# Patient Record
Sex: Female | Born: 1970 | Race: Black or African American | Hispanic: No | Marital: Single | Smoking: Current some day smoker
Health system: Southern US, Community
[De-identification: ages and names within clinical notes are randomized; demographics above are authoritative.]

## PROBLEM LIST (undated history)

## (undated) DIAGNOSIS — R55 Syncope and collapse: Secondary | ICD-10-CM

## (undated) DIAGNOSIS — F329 Major depressive disorder, single episode, unspecified: Secondary | ICD-10-CM

## (undated) DIAGNOSIS — R569 Unspecified convulsions: Secondary | ICD-10-CM

## (undated) HISTORY — DX: Syncope and collapse: R55

---

## 1993-07-10 HISTORY — PX: OTHER SURGICAL HISTORY: SHX169

## 2010-06-21 ENCOUNTER — Ambulatory Visit: Payer: Self-pay | Admitting: Licensed Clinical Social Worker

## 2010-06-30 ENCOUNTER — Ambulatory Visit: Payer: Self-pay | Admitting: Licensed Clinical Social Worker

## 2010-07-12 ENCOUNTER — Ambulatory Visit: Admit: 2010-07-12 | Payer: Self-pay | Admitting: Licensed Clinical Social Worker

## 2013-07-30 ENCOUNTER — Ambulatory Visit
Admission: RE | Admit: 2013-07-30 | Discharge: 2013-07-30 | Disposition: A | Discharge status: Home / Self Care | Payer: Federal, State, Local not specified - PPO | Source: Ambulatory Visit | Attending: Family Medicine | Admitting: Family Medicine

## 2013-07-30 ENCOUNTER — Ambulatory Visit (INDEPENDENT_AMBULATORY_CARE_PROVIDER_SITE_OTHER): Payer: Federal, State, Local not specified - PPO | Admitting: Family Medicine

## 2013-07-30 VITALS — BP 110/80 | HR 90 | Temp 98.3°F | Resp 16 | Ht 61.5 in | Wt 195.2 lb

## 2013-07-30 DIAGNOSIS — D509 Iron deficiency anemia, unspecified: Secondary | ICD-10-CM

## 2013-07-30 DIAGNOSIS — J019 Acute sinusitis, unspecified: Secondary | ICD-10-CM

## 2013-07-30 DIAGNOSIS — R519 Headache, unspecified: Secondary | ICD-10-CM

## 2013-07-30 DIAGNOSIS — R51 Headache: Secondary | ICD-10-CM

## 2013-07-30 LAB — POCT CBC
Granulocyte percent: 61.7 %G (ref 37–80)
HEMATOCRIT: 33.2 % — AB (ref 37.7–47.9)
Hemoglobin: 9.4 g/dL — AB (ref 12.2–16.2)
LYMPH, POC: 3.1 (ref 0.6–3.4)
MCH, POC: 21.1 pg — AB (ref 27–31.2)
MCHC: 28.3 g/dL — AB (ref 31.8–35.4)
MCV: 74.4 fL — AB (ref 80–97)
MID (cbc): 0.7 (ref 0–0.9)
MPV: 7 fL (ref 0–99.8)
POC Granulocyte: 6.2 (ref 2–6.9)
POC LYMPH %: 31.4 % (ref 10–50)
POC MID %: 6.9 %M (ref 0–12)
Platelet Count, POC: 443 10*3/uL — AB (ref 142–424)
RBC: 4.46 M/uL (ref 4.04–5.48)
RDW, POC: 20.5 %
WBC: 10 10*3/uL (ref 4.6–10.2)

## 2013-07-30 LAB — C-REACTIVE PROTEIN

## 2013-07-30 LAB — POCT URINE PREGNANCY: Preg Test, Ur: NEGATIVE

## 2013-07-30 LAB — FERRITIN: Ferritin: 2 ng/mL — ABNORMAL LOW (ref 10–291)

## 2013-07-30 LAB — POCT SEDIMENTATION RATE: POCT SED RATE: 36 mm/hr — AB (ref 0–22)

## 2013-07-30 MED ORDER — AMOXICILLIN-POT CLAVULANATE 875-125 MG PO TABS: 1.0000 | ORAL_TABLET | Freq: Two times a day (BID) | ORAL | Status: DC

## 2013-07-30 MED ORDER — IPRATROPIUM BROMIDE 0.03 % NA SOLN: 2.0000 | Freq: Four times a day (QID) | NASAL | Status: DC

## 2013-07-30 MED ORDER — BUTALBITAL-APAP-CAFFEINE 50-325-40 MG PO TABS: 1.0000 | ORAL_TABLET | Freq: Four times a day (QID) | ORAL | Status: DC | PRN

## 2013-07-30 NOTE — Patient Instructions (Signed)
I will give you a call as soon as your CT results are back,  Assuming your CT looks good we will treat your with an antibiotic for sinusitis (augmentin), atrovent nasal spray for drainage.  You can use the fiorcet medication as needed for headache- however remember this can make you a bit sleepy so do not use it when you need to drive.    Let me know if you are not feeling better in the next few days!    I also did notice that you are anemic; this should be followed up in the next month or so.  I will have the lab check your iron level and let you know the results.

## 2013-07-30 NOTE — Progress Notes (Addendum)
[facility] [address], [place] [address] [phone]  Date:  07/30/2013   Name:  Carol Fowler   DOB:  [birth year]   MRN:  [id number]  PCP:  No PCP Per Patient    Chief Complaint: Headache and sinus pressure   History of Present Illness:  Carol Fowler is a 43 y.o. very pleasant female patient who presents with the following:  Here today with illness.  She is an occasional smoker, otherwise generally healthy.  She is here today with a "terrible HA" for the last 6 days or so.  She notes pressure in her face, if she bends her head down or lays back she has more pain.  The pain is mostly on the right side of her head.  No vision change, no rash to suggest zoster She has not noted a fever, cough, no ST or earache. She has a lot of PND.   No GI symptoms.    Her menses have been spaced out for about 2 years.  She has a Mirena IUD in place.    There are no active problems to display for this patient.   No past medical history on file.  No past surgical history on file.  History  Substance Use Topics  . Smoking status: Current Some Day Smoker  . Smokeless tobacco: Not on file     Comment: smokes teo to three a day and then goes several days without  . Alcohol Use: Not on file    No family history on file.  No Known Allergies  Medication list has been reviewed and updated.  No current outpatient prescriptions on file prior to visit.   No current facility-administered medications on file prior to visit.    Review of Systems:  As per HPI- otherwise negative.   Physical Examination: Filed Vitals:   07/30/13 0836  BP: 98/76  Pulse: 90  Temp: 98.3 F (36.8 C)  Resp: 16   Filed Vitals:   07/30/13 0836  Height: 5' 1.5" (1.562 m)  Weight: 195 lb 3.2 oz (88.542 kg)   Body mass index is 36.29 kg/(m^2). Ideal Body Weight: Weight in (lb) to have BMI = 25: 134.2  GEN: WDWN, NAD, Non-toxic, A & O x 3, looks well, overweight HEENT:  Atraumatic, Normocephalic. Neck supple. No masses, No LAD.  Bilateral TM wnl, oropharynx normal.  PEERL,EOMI.   Ears and Nose: No external deformity. CV: RRR, No M/G/R. No JVD. No thrill. No extra heart sounds. PULM: CTA B, no wheezes, crackles, rhonchi. No retractions. No resp. distress. No accessory muscle use. ABD: S, NT, ND EXTR: No c/c/e NEURO Normal gait. Normal neuro exam- normal strength, sensation and DTR all extremities,  Normal romberg testing PSYCH: Normally interactive. Conversant. Not depressed or anxious appearing.  Calm demeanor.   Results for orders placed in visit on 07/30/13  POCT CBC      Result Value Range   WBC 10.0  4.6 - 10.2 K/uL   Lymph, poc 3.1  0.6 - 3.4   POC LYMPH PERCENT 31.4  10 - 50 %L   MID (cbc) 0.7  0 - 0.9   POC MID % 6.9  0 - 12 %M   POC Granulocyte 6.2  2 - 6.9   Granulocyte percent 61.7  37 - 80 %G   RBC 4.46  4.04 - 5.48 M/uL   Hemoglobin 9.4 (*) 12.2 - 16.2 g/dL   HCT, POC 29.533.2 (*) 62.137.7 - 47.9 %  MCV 74.4 (*) 80 - 97 fL   MCH, POC 21.1 (*) 27 - 31.2 pg   MCHC 28.3 (*) 31.8 - 35.4 g/dL   RDW, POC 16.1     Platelet Count, POC 443 (*) 142 - 424 K/uL   MPV 7.0  0 - 99.8 fL  POCT SEDIMENTATION RATE      Result Value Range   POCT SED RATE 36 (*) 0 - 22 mm/hr  POCT URINE PREGNANCY      Result Value Range   Preg Test, Ur Negative      Assessment and Plan: Severe headache - Plan: POCT CBC, POCT SEDIMENTATION RATE, C-reactive protein, CT Head Wo Contrast, POCT urine pregnancy, butalbital-acetaminophen-caffeine (FIORICET, ESGIC) 50-325-40 MG per tablet  Sinusitis, acute - Plan: ipratropium (ATROVENT) 0.03 % nasal spray, amoxicillin-clavulanate (AUGMENTIN) 875-125 MG per tablet  Microcytic anemia - Plan: Ferritin  Unusual HA.  Suspect sinusitis vs migraine from her symptoms and exam. She is concerned about this unusual and severe HA and would like to proceed with CT.  Assuming negative will treat for sinusitis and associated migraine HA.    Signed Abbe Amsterdam, MD  Sent for CT head today due to unusual HA  CT HEAD WITHOUT CONTRAST  TECHNIQUE: Contiguous axial images were obtained from the base of the skull through the vertex without intravenous contrast. COMPARISON: None.  FINDINGS: Ventricle size is normal. Negative for acute or chronic infarction. Negative for hemorrhage or fluid collection. Negative for mass or edema. No shift of the midline structures.  Calvarium is intact.  IMPRESSION: Normal  Called and let her know.  Will use atrovent NS, augmentin and fiorcet for her symptoms.   Microcytic anemia: check ferritin.  She is currently taking some iron, this may need to be increased. She is no longer losing blood from her menses so may also need to consider hemoccult testing.   She will let me know if not better in the next few days- Sooner if worse.    1/24: gave her a call to check on her.  She is overall feeling better, her sinuses are improved and her HA are less severe.   Discussed her anemia.  Her iron is very low- she is taking 4X 240mg  of iron a day.  She has had problems with anemia for about 5 years; she thought it would get better since she has her mirena IUD and rarely has menstrual bleeding.  However this is not the case.  I will refer her to hematology for further work up.

## 2013-08-02 ENCOUNTER — Encounter: Payer: Self-pay | Admitting: Family Medicine

## 2013-08-02 NOTE — Addendum Note (Signed)
Addended by: Abbe AmsterdamOPLAND, Jamason Peckham C on: 08/02/2013 12:55 PM   Modules accepted: Orders

## 2013-08-04 ENCOUNTER — Telehealth: Payer: Self-pay | Admitting: Family Medicine

## 2013-08-04 NOTE — Telephone Encounter (Signed)
Called the heme- onc clinic today- the referral is in clinic and they will schedule appt

## 2013-08-23 ENCOUNTER — Encounter: Payer: Self-pay | Admitting: Family Medicine

## 2014-04-14 ENCOUNTER — Ambulatory Visit (INDEPENDENT_AMBULATORY_CARE_PROVIDER_SITE_OTHER): Payer: Federal, State, Local not specified - PPO | Admitting: Physician Assistant

## 2014-04-14 VITALS — BP 118/66 | HR 108 | Temp 99.9°F | Resp 18 | Ht 64.0 in | Wt 187.0 lb

## 2014-04-14 DIAGNOSIS — S0181XA Laceration without foreign body of other part of head, initial encounter: Secondary | ICD-10-CM

## 2014-04-14 DIAGNOSIS — S0990XA Unspecified injury of head, initial encounter: Secondary | ICD-10-CM

## 2014-04-14 NOTE — Progress Notes (Signed)
I was directly involved with the patient's care and agree with the physical, diagnosis and treatment plan and actively participated during the procedure.  

## 2014-04-14 NOTE — Patient Instructions (Signed)
WOUND CARE Please return in 5 days to have your stitches/staples removed or sooner if you have concerns.  Keep area clean and dry for 24 hours. Do not remove bandage, if applied.  After 24 hours, remove bandage and wash wound gently with mild soap and warm water. Reapply a new bandage after cleaning wound, if directed.  Continue daily cleansing with soap and water until stitches/staples are removed.  Do not apply any ointments or creams to the wound while stitches/staples are in place, as this may cause delayed healing.  Notify the office if you experience any of the following signs of infection: Swelling, redness, pus drainage, streaking, fever >101.0 F  Notify the office if you experience excessive bleeding that does not stop after 15-20 minutes of constant, firm pressure.  

## 2014-04-14 NOTE — Progress Notes (Signed)
   Subjective:    Patient ID: Carol Fowler, female    DOB: 1970-11-30, 43 y.o.   MRN: [id number]  HPI This is a 43 year old female here for a chief complaint of a facial laceration.  Three hours ago (4pm), she hit her face against the corner of her dresser after she fell off the barstool when she lost her balance while changing the battery of her smoke detector.  She denies blacking out.  She cleaned the wound with peroxide and water.  She went to work, but had a headache and dizziness, and came to urgent care.  She denies nausea, change in vision, or pain with eye movement.  Pain is aggravated by blinking or squinting.    Review of Systems  HENT: Negative for hearing loss.   Eyes: Negative for pain and visual disturbance.  Neurological: Positive for dizziness and headaches. Negative for syncope, weakness and numbness.       Objective:   Physical Exam  Constitutional: She is oriented to person, place, and time. She appears well-developed and well-nourished. No distress.  HENT:  Head: Normocephalic.  Eyes: Conjunctivae are normal. Pupils are equal, round, and reactive to light.  Neck: Normal range of motion. Neck supple.  Neurological: She is alert and oriented to person, place, and time. Cranial nerve deficit: Complete visual fields, normal visual accomodation and eye movement   Skin: Laceration noted.      Procedure: Consent obtained.  1% Lidocaine with Epi injected in wound site.  Anesthesia achieved.  Cleaned wound site.  6-0 ethilon for 7 simple interrupted sutures of 2cm wound.       Assessment & Plan:  43 year old female is here today for chief complaint of lacerated head wound.  After anesthesia, she denied any tenderness which is indicative that the pain is not due to bone injury.  Sutured wound and advised patient to take tylenol if needed for wound pain.    Facial laceration, initial encounter  Head injury, initial encounter   Trena PlattStephanie Mariaceleste Herrera, PA-C [facility] 10/6/20159:17 PM

## 2014-09-21 ENCOUNTER — Ambulatory Visit (INDEPENDENT_AMBULATORY_CARE_PROVIDER_SITE_OTHER): Payer: Federal, State, Local not specified - PPO

## 2014-09-21 ENCOUNTER — Ambulatory Visit (INDEPENDENT_AMBULATORY_CARE_PROVIDER_SITE_OTHER): Payer: Federal, State, Local not specified - PPO | Admitting: Emergency Medicine

## 2014-09-21 VITALS — BP 128/76 | HR 82 | Temp 98.2°F | Resp 18 | Ht 62.5 in | Wt 172.8 lb

## 2014-09-21 DIAGNOSIS — M545 Low back pain, unspecified: Secondary | ICD-10-CM

## 2014-09-21 DIAGNOSIS — Z3200 Encounter for pregnancy test, result unknown: Secondary | ICD-10-CM

## 2014-09-21 DIAGNOSIS — R569 Unspecified convulsions: Secondary | ICD-10-CM

## 2014-09-21 DIAGNOSIS — J302 Other seasonal allergic rhinitis: Secondary | ICD-10-CM | POA: Diagnosis not present

## 2014-09-21 DIAGNOSIS — N3090 Cystitis, unspecified without hematuria: Secondary | ICD-10-CM

## 2014-09-21 LAB — POCT UA - MICROSCOPIC ONLY
Casts, Ur, LPF, POC: NEGATIVE
Crystals, Ur, HPF, POC: NEGATIVE
Mucus, UA: NEGATIVE
Yeast, UA: NEGATIVE

## 2014-09-21 LAB — POCT URINALYSIS DIPSTICK
GLUCOSE UA: NEGATIVE
Ketones, UA: 40
NITRITE UA: NEGATIVE
Protein, UA: NEGATIVE
Spec Grav, UA: 1.01
UROBILINOGEN UA: 1
pH, UA: 6

## 2014-09-21 LAB — POCT URINE PREGNANCY: Preg Test, Ur: NEGATIVE

## 2014-09-21 MED ORDER — TRIAMCINOLONE ACETONIDE 55 MCG/ACT NA AERO: 4.0000 | INHALATION_SPRAY | Freq: Every day | NASAL | Status: DC

## 2014-09-21 MED ORDER — FEXOFENADINE HCL 180 MG PO TABS: 180.0000 mg | ORAL_TABLET | Freq: Every day | ORAL | Status: AC

## 2014-09-21 MED ORDER — NAPROXEN SODIUM 550 MG PO TABS: 550.0000 mg | ORAL_TABLET | Freq: Two times a day (BID) | ORAL | Status: DC

## 2014-09-21 MED ORDER — CIPROFLOXACIN HCL 500 MG PO TABS: 500.0000 mg | ORAL_TABLET | Freq: Two times a day (BID) | ORAL | Status: DC

## 2014-09-21 NOTE — Progress Notes (Addendum)
[facility] [address], [place] [address] [phone]  Date:  09/21/2014   Name:  Carol Fowler   DOB:  August 28, 1970   MRN:  [id number]  PCP:  No PCP Per Patient    Chief Complaint: Nasal Congestion; Sinusitis; Back Pain; and Loss of Consciousness   History of Present Illness:  Carol Fowler is a 44 y.o. very pleasant female patient who presents with the following:  Patient has experienced severe allergic rhinitis since December and has been using claritin in excess of labeled indications She works night shift. Saturday she "fell out" and attributes that to her medication overuse Has a history of evaluation for seizures that she says was negative. Apparently at work she has experienced a number of witnessed seizures where she falls to the ground and has a grand mal seizrue Saturday during seizure she bit her tongue but was not incontinent. Since falling she is experiencing back pain that is not radiating.  Has no neuro symptoms No improvement with over the counter medications or other home remedies.  Denies other complaint or health concern today.   There are no active problems to display for this patient.   Past Medical History  Diagnosis Date  . Syncope     History reviewed. No pertinent past surgical history.  History  Substance Use Topics  . Smoking status: Current Some Day Smoker  . Smokeless tobacco: Not on file     Comment: smokes teo to three a day and then goes several days without  . Alcohol Use: Not on file    History reviewed. No pertinent family history.  No Known Allergies  Medication list has been reviewed and updated.  Current Outpatient Prescriptions on File Prior to Visit  Medication Sig Dispense Refill  . Multiple Vitamins-Minerals (MULTIVITAMIN WITH MINERALS) tablet Take 1 tablet by mouth daily.    Marland Kitchen. amoxicillin-clavulanate (AUGMENTIN) 875-125 MG per tablet Take 1 tablet by mouth 2 (two) times daily. (Patient not  taking: Reported on 09/21/2014) 20 tablet 0  . butalbital-acetaminophen-caffeine (FIORICET, ESGIC) 50-325-40 MG per tablet Take 1 tablet by mouth every 6 (six) hours as needed for headache. (Patient not taking: Reported on 09/21/2014) 20 tablet 0  . cyanocobalamin 500 MCG tablet Take 500 mcg by mouth daily.    . ferrous sulfate 325 (65 FE) MG tablet Take 325 mg by mouth daily with breakfast.    . ipratropium (ATROVENT) 0.03 % nasal spray Place 2 sprays into the nose 4 (four) times daily. (Patient not taking: Reported on 09/21/2014) 30 mL 6   No current facility-administered medications on file prior to visit.    Review of Systems:  As per HPI, otherwise negative.    Physical Examination: Filed Vitals:   09/21/14 0821  BP: 128/76  Pulse: 82  Temp: 98.2 F (36.8 C)  Resp: 18   Filed Vitals:   09/21/14 0821  Height: 5' 2.5" (1.588 m)  Weight: 172 lb 12.8 oz (78.382 kg)   Body mass index is 31.08 kg/(m^2). Ideal Body Weight: Weight in (lb) to have BMI = 25: 138.6  GEN: obese, NAD, Non-toxic, A & O x 3 HEENT: Atraumatic, Normocephalic. Neck supple. No masses, No LAD. Ears and Nose: No external deformity. CV: RRR, No M/G/R. No JVD. No thrill. No extra heart sounds. PULM: CTA B, no wheezes, crackles, rhonchi. No retractions. No resp. distress. No accessory muscle use. ABD: S, NT, ND, +BS. No rebound. No HSM. EXTR: No c/c/e NEURO Normal  gait.  PSYCH: Normally interactive. Conversant. Not depressed or anxious appearing.  Calm demeanor.  Back;  Tender.  No step off.  Assessment and Plan: Seizure disorder Back pain Seasonal allergic rhinitis Cystitis  Signed,  Phillips Odor, MD  [facility] reading (PRIMARY) by  Dr. Dareen Piano  Negative .    Results for orders placed or performed in visit on 09/21/14  POCT urine pregnancy  Result Value Ref Range   Preg Test, Ur Negative   POCT urinalysis dipstick  Result Value Ref Range   Color, UA yellow    Clarity, UA cloudy    Glucose,  UA neg    Bilirubin, UA large    Ketones, UA 40    Spec Grav, UA 1.010    Blood, UA small    pH, UA 6.0    Protein, UA neg    Urobilinogen, UA 1.0    Nitrite, UA neg    Leukocytes, UA large (3+)   POCT UA - Microscopic Only  Result Value Ref Range   WBC, Ur, HPF, POC 15-20    RBC, urine, microscopic 1-2    Bacteria, U Microscopic 4+    Mucus, UA neg    Epithelial cells, urine per micros 0-2    Crystals, Ur, HPF, POC neg    Casts, Ur, LPF, POC neg    Yeast, UA neg

## 2014-09-21 NOTE — Patient Instructions (Signed)
YOU MAY NOT DRIVE UNTIL CLEARED BY NEUROLOGY    Epilepsy Epilepsy is a disorder in which a person has repeated seizures over time. A seizure is a release of abnormal electrical activity in the brain. Seizures can cause a change in attention, behavior, or the ability to remain awake and alert (altered mental status). Seizures often involve uncontrollable shaking (convulsions).  Most people with epilepsy lead normal lives. However, people with epilepsy are at an increased risk of falls, accidents, and injuries. Therefore, it is important to begin treatment right away. CAUSES  Epilepsy has many possible causes. Anything that disturbs the normal pattern of brain cell activity can lead to seizures. This may include:   Head injury.  Birth trauma.  High fever as a child.  Stroke.  Bleeding into or around the brain.  Certain drugs.  Prolonged low oxygen, such as what occurs after CPR efforts.  Abnormal brain development.  Certain illnesses, such as meningitis, encephalitis (brain infection), malaria, and other infections.  An imbalance of nerve signaling chemicals (neurotransmitters).  SIGNS AND SYMPTOMS  The symptoms of a seizure can vary greatly from one person to another. Right before a seizure, you may have a warning (aura) that a seizure is about to occur. An aura may include the following symptoms:  Fear or anxiety.  Nausea.  Feeling like the room is spinning (vertigo).  Vision changes, such as seeing flashing lights or spots. Common symptoms during a seizure include:  Abnormal sensations, such as an abnormal smell or a bitter taste in the mouth.   Sudden, general body stiffness.   Convulsions that involve rhythmic jerking of the face, arm, or leg on one or both sides.   Sudden change in consciousness.   Appearing to be awake but not responding.   Appearing to be asleep but cannot be awakened.   Grimacing, chewing, lip smacking, drooling, tongue biting, or  loss of bowel or bladder control. After a seizure, you may feel sleepy for a while. DIAGNOSIS  Your health care provider will ask about your symptoms and take a medical history. Descriptions from any witnesses to your seizures will be very helpful in the diagnosis. A physical exam, including a detailed neurological exam, is necessary. Various tests may be done, such as:   An electroencephalogram (EEG). This is a painless test of your brain waves. In this test, a diagram is created of your brain waves. These diagrams can be interpreted by a specialist.  An MRI of the brain.   A CT scan of the brain.   A spinal tap (lumbar puncture, LP).  Blood tests to check for signs of infection or abnormal blood chemistry. TREATMENT  There is no cure for epilepsy, but it is generally treatable. Once epilepsy is diagnosed, it is important to begin treatment as soon as possible. For most people with epilepsy, seizures can be controlled with medicines. The following may also be used:  A pacemaker for the brain (vagus nerve stimulator) can be used for people with seizures that are not well controlled by medicine.  Surgery on the brain. For some people, epilepsy eventually goes away. HOME CARE INSTRUCTIONS   Follow your health care provider's recommendations on driving and safety in normal activities.  Get enough rest. Lack of sleep can cause seizures.  Only take over-the-counter or prescription medicines as directed by your health care provider. Take any prescribed medicine exactly as directed.  Avoid any known triggers of your seizures.  Keep a seizure diary. Record what you  recall about any seizure, especially any possible trigger.   Make sure the people you live and work with know that you are prone to seizures. They should receive instructions on how to help you. In general, a witness to a seizure should:   Cushion your head and body.   Turn you on your side.   Avoid unnecessarily  restraining you.   Not place anything inside your mouth.   Call for emergency medical help if there is any question about what has occurred.   Follow up with your health care provider as directed. You may need regular blood tests to monitor the levels of your medicine.  SEEK MEDICAL CARE IF:   You develop signs of infection or other illness. This might increase the risk of a seizure.   You seem to be having more frequent seizures.   Your seizure pattern is changing.  SEEK IMMEDIATE MEDICAL CARE IF:   You have a seizure that does not stop after a few moments.   You have a seizure that causes any difficulty in breathing.   You have a seizure that results in a very severe headache.   You have a seizure that leaves you with the inability to speak or use a part of your body.  Document Released: 06/26/2005 Document Revised: 04/16/2013 Document Reviewed: 02/05/2013 [facility] Patient Information 2015 [employer] [place]. This information is not intended to replace advice given to you by your health care provider. Make sure you discuss any questions you have with your health care provider.

## 2014-09-28 ENCOUNTER — Telehealth: Payer: Self-pay | Admitting: *Deleted

## 2014-09-28 ENCOUNTER — Ambulatory Visit: Payer: Federal, State, Local not specified - PPO | Admitting: Neurology
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
## 2014-10-01 ENCOUNTER — Encounter: Payer: Self-pay | Admitting: Neurology

## 2014-10-01 ENCOUNTER — Ambulatory Visit (INDEPENDENT_AMBULATORY_CARE_PROVIDER_SITE_OTHER): Payer: Federal, State, Local not specified - PPO | Admitting: Neurology

## 2014-10-01 VITALS — BP 109/75 | HR 89 | Ht 62.25 in | Wt 185.0 lb

## 2014-10-01 DIAGNOSIS — R569 Unspecified convulsions: Secondary | ICD-10-CM | POA: Diagnosis not present

## 2014-10-01 MED ORDER — LAMOTRIGINE 100 MG PO TABS: 100.0000 mg | ORAL_TABLET | Freq: Two times a day (BID) | ORAL | Status: DC

## 2014-10-01 MED ORDER — LAMOTRIGINE 25 MG PO TABS: ORAL_TABLET | ORAL | Status: DC

## 2014-10-01 NOTE — Progress Notes (Addendum)
PATIENT: Carol Fowler DOB: June 12, 1971  HISTORICAL  Carol Fowler is a 44 years old left-handed female, referred by her primary care physician Dr. 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, Saturday September 19 2014, she get home earlier around 11 PM, lasting she remember was watching TV, then woke up next morning September 20 2014 lying in the bed, blood all over her pillow, she also noticing that her kitchen table, and 2 chairs was knocked off, she had severe tongue pain, she has bitten her tongue, incontinence,   Seen by her primary care physician next day, CAT scan of the brain was normal,   This is her third episode, first episode happened around 2009, second similar episode in 2010, was witnessed by her daughter, and her sister, she had generalized tonic-clonic seizure activity, she has no warning signs,   Her nephew has generalized epilepsy disorder   REVIEW OF SYSTEMS: Full 14 system review of systems performed and notable only for tongue pain, seizure, depression, not enough sleep, decreased energy, insomnia, anemia, allergy, runny nose, blurry vision, weight gain  ALLERGIES: No Known Allergies  HOME MEDICATIONS: Current Outpatient Prescriptions  Medication Sig Dispense Refill  . ciprofloxacin (CIPRO) 500 MG tablet Take 1 tablet (500 mg total) by mouth 2 (two) times daily. 6 tablet 0  . cyanocobalamin 500 MCG tablet Take 500 mcg by mouth daily.    . fexofenadine (ALLEGRA ALLERGY) 180 MG tablet Take 1 tablet (180 mg total) by mouth daily. 30 tablet 0  . ipratropium (ATROVENT) 0.03 % nasal spray Place 2 sprays into the nose 4 (four) times daily. 30 mL 6  . Multiple Vitamins-Minerals (MULTIVITAMIN WITH MINERALS) tablet Take 1 tablet by mouth daily.       PAST MEDICAL HISTORY: Past Medical History  Diagnosis Date  . Syncope     PAST SURGICAL HISTORY: Past Surgical History  Procedure Laterality  Date  . Caesarian  1995    FAMILY HISTORY: No family history on file.  SOCIAL HISTORY:  History   Social History  . Marital Status: Single    Spouse Name: N/A  . Number of Children: 1  . Years of Education: hs grad   Occupational History  . Fork lift driver  [employer]   Social History Main Topics  . Smoking status: Current Some Day Smoker -- 0.25 packs/day    Types: Cigarettes  . Smokeless tobacco: Not on file     Comment: smokes teo to three a day and then goes several days without  . Alcohol Use: 0.0 oz/week    0 Standard drinks or equivalent per week     Comment: rare  . Drug Use: No  . Sexual Activity: Not on file   Other Topics Concern  . Not on file   Social History Narrative   Caffeine 1-2 cups daily.     PHYSICAL EXAM   Filed Vitals:   10/01/14 0930  BP: 109/75  Pulse: 89  Height: 5' 2.25" (1.581 m)  Weight: 185 lb (83.915 kg)    Not recorded      Body mass index is 33.57 kg/(m^2).  PHYSICAL EXAMNIATION:  Gen: NAD, conversant, well nourised, obese, well groomed                     Cardiovascular: Regular rate rhythm, no peripheral edema, warm, nontender. Eyes: Conjunctivae clear without exudates or  hemorrhage Neck: Supple, no carotid bruise. Pulmonary: Clear to auscultation bilaterally   NEUROLOGICAL EXAM:  MENTAL STATUS: Speech:    Speech is normal; fluent and spontaneous with normal comprehension.  Cognition:    The patient is oriented to person, place, and time;     recent and remote memory intact;     language fluent;     normal attention, concentration,     fund of knowledge.  CRANIAL NERVES: CN II: Visual fields are full to confrontation. Fundoscopic exam is normal with sharp discs and no vascular changes. Venous pulsations are present bilaterally. Pupils are 4 mm and briskly reactive to light. Visual acuity is 20/20 bilaterally. CN III, IV, VI: extraocular movement are normal. No ptosis. CN V: Facial sensation is intact  to pinprick in all 3 divisions bilaterally. Corneal responses are intact.  CN VII: Face is symmetric with normal eye closure and smile. CN VIII: Hearing is normal to rubbing fingers CN IX, X: Palate elevates symmetrically. Phonation is normal. CN XI: Head turning and shoulder shrug are intact CN XII: Tongue is midline with normal movements and no atrophy.  MOTOR: There is no pronator drift of out-stretched arms. Muscle bulk and tone are normal. Muscle strength is normal.   Shoulder abduction Shoulder external rotation Elbow flexion Elbow extension Wrist flexion Wrist extension Finger abduction Hip flexion Knee flexion Knee extension Ankle dorsi flexion Ankle plantar flexion  R L REFLEXES: Reflexes are 2+ and symmetric at the biceps, triceps, knees, and ankles. Plantar responses are flexor.  SENSORY: Light touch, pinprick, position sense, and vibration sense are intact in fingers and toes.  COORDINATION: Rapid alternating movements and fine finger movements are intact. There is no dysmetria on finger-to-nose and heel-knee-shin. There are no abnormal or extraneous movements.   GAIT/STANCE: Posture is normal. Gait is steady with normal steps, base, arm swing, and turning. Heel and toe walking are normal. Tandem gait is normal.  Romberg is absent.   DIAGNOSTIC DATA (LABS, IMAGING, TESTING) - I reviewed patient records, labs, notes, testing and imaging myself where available.  Lab Results  Component Value Date   WBC 10.0 07/30/2013   HGB 9.4* 07/30/2013   HCT 33.2* 07/30/2013   MCV 74.4* 07/30/2013    ASSESSMENT AND PLAN  Carol Fowler is a 44 y.o. female with 3 generalized seizure,  1, complete evaluation with MRI of the brain with without contrast, EEG, 2. Laboratory evaluations 3. Start lamotrigine, titrating to 100 mg twice a day    Orders Placed This Encounter  Procedures  . MR Brain W Wo Contrast  .  Comprehensive metabolic panel  . TSH  . CBC  . EEG adult    New Prescriptions   LAMOTRIGINE (LAMICTAL) 100 MG TABLET    Take 1 tablet (100 mg total) by mouth 2 (two) times daily.   LAMOTRIGINE (LAMICTAL) 25 MG TABLET    One tab po bid xone week, then 2 tabs po bid xone week, then 3 tabs po bid    Medications Discontinued During This Encounter  Medication Reason  . amoxicillin-clavulanate (AUGMENTIN) 875-125 MG per tablet Completed Course  . butalbital-acetaminophen-caffeine (FIORICET, ESGIC) 50-325-40 MG per tablet Completed Course  . ferrous sulfate 325 (65 FE) MG tablet Completed Course  . naproxen sodium (ANAPROX DS) 550 MG tablet Completed Course  . triamcinolone (NASACORT AQ)  55 MCG/ACT AERO nasal inhaler Side effect (s)    Return in about 1 month (around 11/01/2014).   Levert FeinsteinYijun Osei Anger, M.D. Ph.D.  [facility] [address], [address] [place], [place] [address] Ph: [phone]([phone] Fax: [phone]

## 2014-10-02 LAB — CBC
HEMATOCRIT: 41.8 % (ref 34.0–46.6)
HEMOGLOBIN: 13.9 g/dL (ref 11.1–15.9)
MCH: 31.5 pg (ref 26.6–33.0)
MCHC: 33.3 g/dL (ref 31.5–35.7)
MCV: 95 fL (ref 79–97)
Platelets: 395 10*3/uL — ABNORMAL HIGH (ref 150–379)
RBC: 4.41 x10E6/uL (ref 3.77–5.28)
RDW: 14.3 % (ref 12.3–15.4)
WBC: 8.4 10*3/uL (ref 3.4–10.8)

## 2014-10-02 LAB — COMPREHENSIVE METABOLIC PANEL
ALT: 56 IU/L — AB (ref 0–32)
AST: 36 IU/L (ref 0–40)
Albumin/Globulin Ratio: 1.6 (ref 1.1–2.5)
Albumin: 3.9 g/dL (ref 3.5–5.5)
Alkaline Phosphatase: 65 IU/L (ref 39–117)
BUN / CREAT RATIO: 17 (ref 9–23)
BUN: 11 mg/dL (ref 6–24)
CHLORIDE: 105 mmol/L (ref 97–108)
CO2: 21 mmol/L (ref 18–29)
Calcium: 9.5 mg/dL (ref 8.7–10.2)
Creatinine, Ser: 0.64 mg/dL (ref 0.57–1.00)
GFR calc non Af Amer: 110 mL/min/{1.73_m2} (ref >59)
GFR, EST AFRICAN AMERICAN: 127 mL/min/{1.73_m2} (ref >59)
GLUCOSE: 97 mg/dL (ref 65–99)
Globulin, Total: 2.5 g/dL (ref 1.5–4.5)
POTASSIUM: 5.1 mmol/L (ref 3.5–5.2)
Sodium: 142 mmol/L (ref 134–144)
Total Protein: 6.4 g/dL (ref 6.0–8.5)

## 2014-10-02 LAB — TSH: TSH: 0.747 u[IU]/mL (ref 0.450–4.500)

## 2014-10-05 ENCOUNTER — Telehealth: Payer: Self-pay | Admitting: Neurology

## 2014-10-05 NOTE — Telephone Encounter (Signed)
Spoke to patient - aware of results. 

## 2014-10-05 NOTE — Telephone Encounter (Signed)
Patient is returning call in regard to the results of her blood work last week.  Please call.

## 2014-10-05 NOTE — Telephone Encounter (Signed)
Pt is calling back stating she will be at work and it is ok to leave a detailed message regarding her lab work on her phone.  She states she would like to get a call back today.  Please adivse.

## 2014-10-07 ENCOUNTER — Telehealth: Payer: Self-pay | Admitting: *Deleted

## 2014-10-07 ENCOUNTER — Ambulatory Visit (INDEPENDENT_AMBULATORY_CARE_PROVIDER_SITE_OTHER): Payer: Federal, State, Local not specified - PPO | Admitting: Neurology

## 2014-10-07 DIAGNOSIS — R569 Unspecified convulsions: Secondary | ICD-10-CM | POA: Diagnosis not present

## 2014-10-07 NOTE — Procedures (Signed)
    History:  Carol Fowler is a 44 year old patient with a history of an event on 09/19/2014. The patient awakened from sleep, she had bitten her tongue, and she had blood on her pillow. She had incontinence of urine. The patient is being evaluated for a possible seizure event.  This is a routine EEG. No skull defects are noted. Medications include Allegra, Atrovent, Lamictal, and multivitamins.   EEG classification: Normal awake and asleep  Description of the recording: The background rhythms of this recording consists of a fairly well modulated medium amplitude background activity of 11 Hz. As the record progresses, the patient initially is in the waking state, but appears to enter the early stage II sleep during the recording, with rudimentary sleep spindles and vertex sharp wave activity seen. During the wakeful state, photic stimulation is performed, and this results in a bilateral and symmetric photic driving response. Hyperventilation was also performed, and this results in a minimal buildup of the background rhythm activities without significant slowing seen. At no time during the recording does there appear to be evidence of spike or spike wave discharges or evidence of focal slowing. EKG monitor shows no evidence of cardiac rhythm abnormalities with a heart rate of 66.  Impression: This is a normal EEG recording in the waking and sleeping state. No evidence of ictal or interictal discharges were seen at any time during the recording.

## 2014-10-07 NOTE — Telephone Encounter (Signed)
Patient FMLA form on Michelle desk. 

## 2014-10-07 NOTE — Telephone Encounter (Signed)
Patient aware of normal EEG results.

## 2014-10-08 DIAGNOSIS — Z0289 Encounter for other administrative examinations: Secondary | ICD-10-CM

## 2014-10-14 NOTE — Telephone Encounter (Signed)
Returning to medical records for processing.

## 2014-10-14 NOTE — Telephone Encounter (Signed)
Patient checking status of FMLA paperwork.  Please call and advise.

## 2014-10-14 NOTE — Telephone Encounter (Signed)
On Dr. Zannie CoveYan's desk for review.

## 2014-10-15 ENCOUNTER — Telehealth: Payer: Self-pay | Admitting: *Deleted

## 2014-10-15 NOTE — Telephone Encounter (Signed)
Patient form at the front desk for pickup. 

## 2014-11-05 ENCOUNTER — Ambulatory Visit (INDEPENDENT_AMBULATORY_CARE_PROVIDER_SITE_OTHER): Payer: Federal, State, Local not specified - PPO | Admitting: Neurology

## 2014-11-05 ENCOUNTER — Encounter: Payer: Self-pay | Admitting: Neurology

## 2014-11-05 VITALS — BP 121/85 | HR 83 | Ht 62.25 in | Wt 173.0 lb

## 2014-11-05 DIAGNOSIS — R569 Unspecified convulsions: Secondary | ICD-10-CM

## 2014-11-05 MED ORDER — LAMOTRIGINE 100 MG PO TABS: ORAL_TABLET | ORAL | Status: DC

## 2014-11-05 NOTE — Progress Notes (Signed)
PATIENT: Carol Fowler DOB: Feb 11, 1971  HISTORICAL  Carol Fowler is a 44 years old left-handed female, referred by her primary care physician Dr. 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, Saturday September 19 2014, she get home earlier around 11 PM, lasting she remember was watching TV, then woke up next morning September 20 2014 lying in the bed, blood all over her pillow, she also noticing that her kitchen table, and 2 chairs was knocked off, she had severe tongue pain, she has bitten her tongue, incontinence,   Seen by her primary care physician next day, CAT scan of the brain was normal,   This is her third episode, first episode happened around 2009, second similar episode in 2010, was witnessed by her daughter, and her sister, she had generalized tonic-clonic seizure activity, she has no warning signs,   Her nephew has generalized epilepsy disorder   UPDATE April 28th  Lab showed normal CBC, CMP, TSH, EEG was normal. She went to bed fine at April 22nd 2016, she did not wake up until late April 23, has missed her job, this happened while she is taking lamotrigine 100 mg twice a day, she tolerated the medication well,  MRI of the brain is pending REVIEW OF SYSTEMS: Full 14 system review of systems performed and notable only for tongue pain, seizure, depression, not enough sleep, decreased energy, insomnia, anemia, allergy, runny nose, blurry vision, weight gain  ALLERGIES: No Known Allergies  HOME MEDICATIONS: Current Outpatient Prescriptions  Medication Sig Dispense Refill  . ciprofloxacin (CIPRO) 500 MG tablet Take 1 tablet (500 mg total) by mouth 2 (two) times daily. 6 tablet 0  . cyanocobalamin 500 MCG tablet Take 500 mcg by mouth daily.    . fexofenadine (ALLEGRA ALLERGY) 180 MG tablet Take 1 tablet (180 mg total) by mouth daily. 30 tablet 0  . ipratropium (ATROVENT) 0.03 % nasal spray Place 2  sprays into the nose 4 (four) times daily. 30 mL 6  . Multiple Vitamins-Minerals (MULTIVITAMIN WITH MINERALS) tablet Take 1 tablet by mouth daily.       PAST MEDICAL HISTORY: Past Medical History  Diagnosis Date  . Syncope     PAST SURGICAL HISTORY: Past Surgical History  Procedure Laterality Date  . Caesarian  1995    FAMILY HISTORY: No family history on file.  SOCIAL HISTORY:  History   Social History  . Marital Status: Single    Spouse Name: N/A  . Number of Children: 1  . Years of Education: hs grad   Occupational History  . Fork lift driver  [employer]   Social History Main Topics  . Smoking status: Current Some Day Smoker -- 0.25 packs/day    Types: Cigarettes  . Smokeless tobacco: Not on file     Comment: smokes teo to three a day and then goes several days without  . Alcohol Use: 0.0 oz/week    0 Standard drinks or equivalent per week     Comment: rare  . Drug Use: No  . Sexual Activity: Not on file   Other Topics Concern  . Not on file   Social History Narrative   Caffeine 1-2 cups daily.     PHYSICAL EXAM   Filed Vitals:   11/05/14 1041  BP: 121/85  Pulse: 83  Height: 5' 2.25" (1.581 m)  Weight: 173 lb (78.472 kg)    Not  recorded      Body mass index is 31.39 kg/(m^2).  PHYSICAL EXAMNIATION:  Gen: NAD, conversant, well nourised, obese, well groomed                     Cardiovascular: Regular rate rhythm, no peripheral edema, warm, nontender. Eyes: Conjunctivae clear without exudates or hemorrhage Neck: Supple, no carotid bruise. Pulmonary: Clear to auscultation bilaterally   NEUROLOGICAL EXAM:  MENTAL STATUS: Speech:    Speech is normal; fluent and spontaneous with normal comprehension.  Cognition:    The patient is oriented to person, place, and time;     recent and remote memory intact;     language fluent;     normal attention, concentration,     fund of knowledge.  CRANIAL NERVES: CN II: Visual fields are full  to confrontation. Fundoscopic exam is normal with sharp discs and no vascular changes. Venous pulsations are present bilaterally. Pupils are 4 mm and briskly reactive to light. Visual acuity is 20/20 bilaterally. CN III, IV, VI: extraocular movement are normal. No ptosis. CN V: Facial sensation is intact to pinprick in all 3 divisions bilaterally. Corneal responses are intact.  CN VII: Face is symmetric with normal eye closure and smile. CN VIII: Hearing is normal to rubbing fingers CN IX, X: Palate elevates symmetrically. Phonation is normal. CN XI: Head turning and shoulder shrug are intact CN XII: Tongue is midline with normal movements and no atrophy.  MOTOR: There is no pronator drift of out-stretched arms. Muscle bulk and tone are normal. Muscle strength is normal.   Shoulder abduction Shoulder external rotation Elbow flexion Elbow extension Wrist flexion Wrist extension Finger abduction Hip flexion Knee flexion Knee extension Ankle dorsi flexion Ankle plantar flexion  R 5 5 5 5 5 5 5 5 5 5 5 5   L 5 5 5 5 5 5 5 5 5 5 5 5     REFLEXES: Reflexes are 2+ and symmetric at the biceps, triceps, knees, and ankles. Plantar responses are flexor.  SENSORY: Light touch, pinprick, position sense, and vibration sense are intact in fingers and toes.  COORDINATION: Rapid alternating movements and fine finger movements are intact. There is no dysmetria on finger-to-nose and heel-knee-shin. There are no abnormal or extraneous movements.   GAIT/STANCE: Posture is normal. Gait is steady with normal steps, base, arm swing, and turning. Heel and toe walking are normal. Tandem gait is normal.  Romberg is absent.   DIAGNOSTIC DATA (LABS, IMAGING, TESTING) - I reviewed patient records, labs, notes, testing and imaging myself where available.  Lab Results  Component Value Date   WBC 8.4 10/01/2014   HGB 13.9 10/01/2014   HCT 41.8 10/01/2014   MCV 95 10/01/2014   PLT 395* 10/01/2014     ASSESSMENT AND PLAN  Burlene Arntamara J Steller is a 44 y.o. female with 3 generalized seizure,  1, complete evaluation with MRI of the brain with without contrast,  2, continue lamotrigine 100 mg twice a day 3, if she continue have recurrent spells, may consider home video EEG monitoring 4. RTC in Nov 27 2014    Levert FeinsteinYijun Zykira Matlack, M.D. Ph.D.  [facility] [address], [address] [place], [place] [address] Ph: [phone]) [phone] Fax: [phone]

## 2014-11-13 ENCOUNTER — Telehealth: Payer: Self-pay

## 2014-11-13 NOTE — Telephone Encounter (Signed)
Contacting pt to reschedule appointment, voicemail left informing pt to call back, when rescheduling please schedule with Dr. Terrace ArabiaYan in a 15 min slot thanks

## 2014-11-23 ENCOUNTER — Telehealth: Payer: Self-pay | Admitting: *Deleted

## 2014-11-23 ENCOUNTER — Ambulatory Visit: Payer: Self-pay | Admitting: Neurology

## 2014-11-23 NOTE — Telephone Encounter (Signed)
Patient no showed her appointment today. °

## 2014-11-24 ENCOUNTER — Encounter: Payer: Self-pay | Admitting: Neurology

## 2014-11-26 ENCOUNTER — Ambulatory Visit: Payer: Self-pay | Admitting: Neurology

## 2014-11-27 ENCOUNTER — Ambulatory Visit: Payer: Federal, State, Local not specified - PPO | Admitting: Neurology

## 2014-12-10 ENCOUNTER — Telehealth: Payer: Self-pay | Admitting: *Deleted

## 2014-12-10 ENCOUNTER — Encounter: Payer: Self-pay | Admitting: *Deleted

## 2014-12-10 NOTE — Telephone Encounter (Signed)
Patient letter at the front desk.

## 2014-12-31 ENCOUNTER — Ambulatory Visit (INDEPENDENT_AMBULATORY_CARE_PROVIDER_SITE_OTHER): Payer: Federal, State, Local not specified - PPO | Admitting: Neurology

## 2014-12-31 ENCOUNTER — Encounter: Payer: Self-pay | Admitting: Neurology

## 2014-12-31 ENCOUNTER — Telehealth: Payer: Self-pay | Admitting: Neurology

## 2014-12-31 VITALS — BP 141/96 | HR 73 | Ht 62.25 in | Wt 172.0 lb

## 2014-12-31 DIAGNOSIS — R569 Unspecified convulsions: Secondary | ICD-10-CM | POA: Diagnosis not present

## 2014-12-31 MED ORDER — LAMOTRIGINE 100 MG PO TABS: ORAL_TABLET | ORAL | Status: DC

## 2014-12-31 NOTE — Progress Notes (Signed)
Chief Complaint  Patient presents with  . Seizures    Reports having an episode on 11/14/14 where she passed out completely.  She injured her right knee in the fall.  She had not missed any doses of her medication.  She was not able to have the MRI due to finanacial constraints.      PATIENT: Carol Fowler DOB: 1971/07/04  HISTORICAL  Carol Fowler is a 44 years old left-handed female, referred by her primary care physician Dr. 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, Saturday September 19 2014, she get home earlier around 11 PM, last thing she remember was watching TV, then woke up next morning September 20 2014 lying in the bed, blood all over her pillow, she also noticing that her kitchen table and 2 chairs was knocked off, she had severe tongue pain, she has bitten her tongue, urinary incontinence,   Seen by her primary care physician next day, CAT scan of the brain was normal,   This is her third episode, first episode happened around 2009, second similar episode in 2010, was witnessed by her daughter and her sister, she had generalized tonic-clonic seizure activity, she has no warning signs,   Her nephew has generalized epilepsy disorder   UPDATE April 28th  Lab showed normal CBC, CMP, TSH, EEG was normal. She went to bed fine at April 22nd 2016, she did not wake up until late April 23, has missed her job, this happened while she is taking lamotrigine 100 mg twice a day, she tolerated the medication well,  MRI of the brain is pending  UPDATE June 22nd 2016:  She had another recurrent episode in Nov 14 2014, at work, she noticed dizziness, not feeling well, she was able to drop herself back home, fell off the steps, but could not recall details, felt like she had another spells.  She did not have MRI of the brain as previously scheduled, she is also confused about the dosage of her medications, she came in  with lamotrigine bottle that was refilled in October 01 2014, she still has few tablets left  REVIEW OF SYSTEMS: Full 14 system review of systems performed and notable only for seizure, confusion, depression anxiety low back pain, appetite change, excessive sweating, ALLERGIES: No Known Allergies  HOME MEDICATIONS: Current Outpatient Prescriptions  Medication Sig Dispense Refill  . ciprofloxacin (CIPRO) 500 MG tablet Take 1 tablet (500 mg total) by mouth 2 (two) times daily. 6 tablet 0  . cyanocobalamin 500 MCG tablet Take 500 mcg by mouth daily.    . fexofenadine (ALLEGRA ALLERGY) 180 MG tablet Take 1 tablet (180 mg total) by mouth daily. 30 tablet 0  . ipratropium (ATROVENT) 0.03 % nasal spray Place 2 sprays into the nose 4 (four) times daily. 30 mL 6  . Multiple Vitamins-Minerals (MULTIVITAMIN WITH MINERALS) tablet Take 1 tablet by mouth daily.       PAST MEDICAL HISTORY: Past Medical History  Diagnosis Date  . Syncope     PAST SURGICAL HISTORY: Past Surgical History  Procedure Laterality Date  . Caesarian  1995    FAMILY HISTORY: No family history on file.  SOCIAL HISTORY:  History   Social History  . Marital Status: Single    Spouse Name: N/A  . Number of Children: 1  . Years of Education: hs grad   Occupational History  . Fork Patent examiner  [employer]   Social History Main Topics  . Smoking status: Current Some Day Smoker -- 0.25 packs/day    Types: Cigarettes  . Smokeless tobacco: Not on file     Comment: smokes teo to three a day and then goes several days without  . Alcohol Use: 0.0 oz/week    0 Standard drinks or equivalent per week     Comment: rare  . Drug Use: No  . Sexual Activity: Not on file   Other Topics Concern  . Not on file   Social History Narrative   Caffeine 1-2 cups daily.     PHYSICAL EXAM   Filed Vitals:   12/31/14 0838  BP: 141/96  Pulse: 73  Height: 5' 2.25" (1.581 m)  Weight: 172 lb (78.019 kg)    Not recorded        Body mass index is 31.21 kg/(m^2).  PHYSICAL EXAMNIATION:  Gen: NAD, conversant, well nourised, obese, well groomed                     Cardiovascular: Regular rate rhythm, no peripheral edema, warm, nontender. Eyes: Conjunctivae clear without exudates or hemorrhage Neck: Supple, no carotid bruise. Pulmonary: Clear to auscultation bilaterally   NEUROLOGICAL EXAM:  MENTAL STATUS: Speech:    Speech is normal; fluent and spontaneous with normal comprehension.  Cognition:    The patient is oriented to person, place, and time;     recent and remote memory intact;     language fluent;     normal attention, concentration,     fund of knowledge.  CRANIAL NERVES: CN II: Visual fields are full to confrontation. Fundoscopic exam is normal with sharp discs and no vascular changes. Pupil equal round reactive to light. CN III, IV, VI: extraocular movement are normal. No ptosis. CN V: Facial sensation is intact to pinprick in all 3 divisions bilaterally. Corneal responses are intact.  CN VII: Face is symmetric with normal eye closure and smile. CN VIII: Hearing is normal to rubbing fingers CN IX, X: Palate elevates symmetrically. Phonation is normal. CN XI: Head turning and shoulder shrug are intact CN XII: Tongue is midline with normal movements and no atrophy.  MOTOR: There is no pronator drift of out-stretched arms. Muscle bulk and tone are normal. Muscle strength is normal.   REFLEXES: Reflexes are 2+ and symmetric at the biceps, triceps, knees, and ankles. Plantar responses are flexor.  SENSORY: Light touch, pinprick, position sense, and vibration sense are intact in fingers and toes.  COORDINATION: Rapid alternating movements and fine finger movements are intact. There is no dysmetria on finger-to-nose and heel-knee-shin. There are no abnormal or extraneous movements.   GAIT/STANCE: Posture is normal. Gait is steady with normal steps, base, arm swing, and turning. Heel and  toe walking are normal. Tandem gait is normal.  Romberg is absent.   DIAGNOSTIC DATA (LABS, IMAGING, TESTING) - I reviewed patient records, labs, notes, testing and imaging myself where available.  Lab Results  Component Value Date   WBC 8.4 10/01/2014   HGB 13.9 10/01/2014   HCT 41.8 10/01/2014   MCV 95 10/01/2014   PLT 395* 10/01/2014    ASSESSMENT AND PLAN  Carol Fowler is a 44 y.o. female with recurrent  seizure,  1, complete evaluation with MRI of the brain with without contrast,  2, continue lamotrigine 150 mg twice a day 3, return to clinic in 2 months  Levert Feinstein, M.D. Ph.D.  [facility]  [address] [place], [place] [address] Ph: [phone] Fax: [phone]

## 2014-12-31 NOTE — Addendum Note (Signed)
Addended by: Levert Feinstein on: 12/31/2014 09:14 AM   Modules accepted: Orders

## 2014-12-31 NOTE — Telephone Encounter (Signed)
Rx has been resent for 90 day supply.  Pharmacy is aware, and has confirmed receipt.

## 2014-12-31 NOTE — Telephone Encounter (Signed)
Tom with [facility] on [address] called requesting new RX for lamoTRIgine (LAMICTAL) 100 MG tablet . Patient is requesting 90 supply also. He can be reached at [phone].

## 2015-01-13 ENCOUNTER — Ambulatory Visit (INDEPENDENT_AMBULATORY_CARE_PROVIDER_SITE_OTHER): Payer: Federal, State, Local not specified - PPO

## 2015-01-13 DIAGNOSIS — R569 Unspecified convulsions: Secondary | ICD-10-CM

## 2015-01-14 ENCOUNTER — Telehealth: Payer: Self-pay | Admitting: Neurology

## 2015-01-14 MED ORDER — GADOPENTETATE DIMEGLUMINE 469.01 MG/ML IV SOLN: 17.0000 mL | Freq: Once | INTRAVENOUS | Status: AC | PRN

## 2015-01-14 NOTE — Telephone Encounter (Signed)
Spoke to patient she is aware of results

## 2015-01-14 NOTE — Telephone Encounter (Signed)
Michelle: Please call patient, MRI brain showed no significant abnormalities.  Equivocal MRI brain (with and without) demonstrating: 1. Trivial periventricular and left frontal subcortical punctate foci of non-specific gliosis. 2. No abnormal lesions are seen on post contrast views.  3. No acute findings.

## 2015-01-14 NOTE — Telephone Encounter (Signed)
Left message for a return call

## 2015-03-11 ENCOUNTER — Ambulatory Visit: Payer: Self-pay | Admitting: Neurology

## 2015-03-11 ENCOUNTER — Telehealth: Payer: Self-pay | Admitting: *Deleted

## 2015-03-11 ENCOUNTER — Telehealth: Payer: Self-pay | Admitting: Neurology

## 2015-03-11 NOTE — Telephone Encounter (Signed)
Spoke to patient - she is coming in to see Carol Fowler on Tuesday, 03/16/15.  I instructed her to rest, continue her medication as prescribed and proceed to the ED at any time she feels her safety is at risk.

## 2015-03-11 NOTE — Telephone Encounter (Signed)
No showed new patient appt 
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
## 2015-03-13 ENCOUNTER — Encounter (HOSPITAL_COMMUNITY): Payer: Self-pay | Admitting: Nurse Practitioner

## 2015-03-13 ENCOUNTER — Inpatient Hospital Stay (HOSPITAL_COMMUNITY)
Admission: EM | Admit: 2015-03-13 | Discharge: 2015-03-16 | DRG: 558 | Disposition: A | Discharge status: Home / Self Care | Payer: Federal, State, Local not specified - PPO | Attending: Internal Medicine | Admitting: Internal Medicine

## 2015-03-13 DIAGNOSIS — F331 Major depressive disorder, recurrent, moderate: Secondary | ICD-10-CM | POA: Diagnosis present

## 2015-03-13 DIAGNOSIS — R202 Paresthesia of skin: Secondary | ICD-10-CM

## 2015-03-13 DIAGNOSIS — D72829 Elevated white blood cell count, unspecified: Secondary | ICD-10-CM | POA: Diagnosis present

## 2015-03-13 DIAGNOSIS — W19XXXA Unspecified fall, initial encounter: Secondary | ICD-10-CM | POA: Diagnosis present

## 2015-03-13 DIAGNOSIS — S80812A Abrasion, left lower leg, initial encounter: Secondary | ICD-10-CM | POA: Diagnosis present

## 2015-03-13 DIAGNOSIS — S50311A Abrasion of right elbow, initial encounter: Secondary | ICD-10-CM | POA: Diagnosis present

## 2015-03-13 DIAGNOSIS — M6282 Rhabdomyolysis: Secondary | ICD-10-CM | POA: Diagnosis not present

## 2015-03-13 DIAGNOSIS — F419 Anxiety disorder, unspecified: Secondary | ICD-10-CM | POA: Diagnosis present

## 2015-03-13 DIAGNOSIS — Z79899 Other long term (current) drug therapy: Secondary | ICD-10-CM

## 2015-03-13 DIAGNOSIS — S80212A Abrasion, left knee, initial encounter: Secondary | ICD-10-CM | POA: Diagnosis present

## 2015-03-13 DIAGNOSIS — F339 Major depressive disorder, recurrent, unspecified: Secondary | ICD-10-CM | POA: Diagnosis present

## 2015-03-13 DIAGNOSIS — F17210 Nicotine dependence, cigarettes, uncomplicated: Secondary | ICD-10-CM | POA: Diagnosis present

## 2015-03-13 DIAGNOSIS — R569 Unspecified convulsions: Secondary | ICD-10-CM

## 2015-03-13 DIAGNOSIS — R440 Auditory hallucinations: Secondary | ICD-10-CM | POA: Diagnosis present

## 2015-03-13 DIAGNOSIS — R531 Weakness: Secondary | ICD-10-CM

## 2015-03-13 DIAGNOSIS — S80211A Abrasion, right knee, initial encounter: Secondary | ICD-10-CM | POA: Diagnosis present

## 2015-03-13 DIAGNOSIS — G40909 Epilepsy, unspecified, not intractable, without status epilepticus: Secondary | ICD-10-CM | POA: Diagnosis present

## 2015-03-13 DIAGNOSIS — S80811A Abrasion, right lower leg, initial encounter: Secondary | ICD-10-CM | POA: Diagnosis present

## 2015-03-13 DIAGNOSIS — S50312A Abrasion of left elbow, initial encounter: Secondary | ICD-10-CM | POA: Diagnosis present

## 2015-03-13 HISTORY — DX: Unspecified convulsions: R56.9

## 2015-03-13 HISTORY — DX: Major depressive disorder, single episode, unspecified: F32.9
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
## 2015-03-13 NOTE — ED Notes (Signed)
Bed: ZO10 Expected date:  Expected time:  Means of arrival:  Comments: 67F neck/back pain fell out of bed 3am last night

## 2015-03-13 NOTE — ED Notes (Addendum)
Pt is presented from home, states she fell last night at 3 a.m after having a seizure which she states she has a hx of. C/o pain generalized 10/10 mostly in the neck and back. C-Collar in place by medics on arrival.

## 2015-03-14 ENCOUNTER — Emergency Department (HOSPITAL_COMMUNITY): Payer: Federal, State, Local not specified - PPO

## 2015-03-14 ENCOUNTER — Encounter (HOSPITAL_COMMUNITY): Payer: Self-pay | Admitting: *Deleted

## 2015-03-14 DIAGNOSIS — F419 Anxiety disorder, unspecified: Secondary | ICD-10-CM | POA: Diagnosis present

## 2015-03-14 DIAGNOSIS — F17210 Nicotine dependence, cigarettes, uncomplicated: Secondary | ICD-10-CM | POA: Diagnosis present

## 2015-03-14 DIAGNOSIS — S50312A Abrasion of left elbow, initial encounter: Secondary | ICD-10-CM | POA: Diagnosis present

## 2015-03-14 DIAGNOSIS — S80812A Abrasion, left lower leg, initial encounter: Secondary | ICD-10-CM | POA: Diagnosis present

## 2015-03-14 DIAGNOSIS — S80212A Abrasion, left knee, initial encounter: Secondary | ICD-10-CM | POA: Diagnosis present

## 2015-03-14 DIAGNOSIS — R202 Paresthesia of skin: Secondary | ICD-10-CM | POA: Diagnosis present

## 2015-03-14 DIAGNOSIS — Z79899 Other long term (current) drug therapy: Secondary | ICD-10-CM | POA: Diagnosis not present

## 2015-03-14 DIAGNOSIS — F331 Major depressive disorder, recurrent, moderate: Secondary | ICD-10-CM | POA: Diagnosis present

## 2015-03-14 DIAGNOSIS — S80211A Abrasion, right knee, initial encounter: Secondary | ICD-10-CM | POA: Diagnosis present

## 2015-03-14 DIAGNOSIS — G40909 Epilepsy, unspecified, not intractable, without status epilepticus: Secondary | ICD-10-CM | POA: Diagnosis present

## 2015-03-14 DIAGNOSIS — S50311A Abrasion of right elbow, initial encounter: Secondary | ICD-10-CM | POA: Diagnosis present

## 2015-03-14 DIAGNOSIS — T796XXA Traumatic ischemia of muscle, initial encounter: Secondary | ICD-10-CM | POA: Diagnosis not present

## 2015-03-14 DIAGNOSIS — R569 Unspecified convulsions: Secondary | ICD-10-CM

## 2015-03-14 DIAGNOSIS — M6282 Rhabdomyolysis: Secondary | ICD-10-CM | POA: Diagnosis present

## 2015-03-14 DIAGNOSIS — F339 Major depressive disorder, recurrent, unspecified: Secondary | ICD-10-CM | POA: Diagnosis present

## 2015-03-14 DIAGNOSIS — R440 Auditory hallucinations: Secondary | ICD-10-CM | POA: Diagnosis present

## 2015-03-14 DIAGNOSIS — W19XXXA Unspecified fall, initial encounter: Secondary | ICD-10-CM | POA: Diagnosis present

## 2015-03-14 DIAGNOSIS — D72829 Elevated white blood cell count, unspecified: Secondary | ICD-10-CM | POA: Diagnosis present

## 2015-03-14 DIAGNOSIS — S80811A Abrasion, right lower leg, initial encounter: Secondary | ICD-10-CM | POA: Diagnosis present

## 2015-03-14 LAB — PROTIME-INR
INR: 1.02 (ref 0.00–1.49)
Prothrombin Time: 13.6 seconds (ref 11.6–15.2)

## 2015-03-14 LAB — URINALYSIS, ROUTINE W REFLEX MICROSCOPIC
Glucose, UA: NEGATIVE mg/dL
Ketones, ur: 40 mg/dL — AB
Leukocytes, UA: NEGATIVE
Nitrite: NEGATIVE
Protein, ur: 100 mg/dL — AB
Specific Gravity, Urine: 1.03 (ref 1.005–1.030)
Urobilinogen, UA: 0.2 mg/dL (ref 0.0–1.0)
pH: 6.5 (ref 5.0–8.0)

## 2015-03-14 LAB — RAPID URINE DRUG SCREEN, HOSP PERFORMED
Amphetamines: NOT DETECTED
Barbiturates: NOT DETECTED
Benzodiazepines: NOT DETECTED
Cocaine: NOT DETECTED
Opiates: NOT DETECTED
Tetrahydrocannabinol: NOT DETECTED

## 2015-03-14 LAB — BASIC METABOLIC PANEL
Anion gap: 12 (ref 5–15)
BUN: 11 mg/dL (ref 6–20)
CO2: 23 mmol/L (ref 22–32)
Calcium: 8.8 mg/dL — ABNORMAL LOW (ref 8.9–10.3)
Chloride: 104 mmol/L (ref 101–111)
Creatinine, Ser: 0.87 mg/dL (ref 0.44–1.00)
GFR calc Af Amer: >60 mL/min (ref >60)
GFR calc non Af Amer: >60 mL/min (ref >60)
Glucose, Bld: 109 mg/dL — ABNORMAL HIGH (ref 65–99)
Potassium: 3.7 mmol/L (ref 3.5–5.1)
Sodium: 139 mmol/L (ref 135–145)

## 2015-03-14 LAB — URINE MICROSCOPIC-ADD ON

## 2015-03-14 LAB — CBC WITH DIFFERENTIAL/PLATELET
Basophils Absolute: 0 10*3/uL (ref 0.0–0.1)
Basophils Relative: 0 % (ref 0–1)
Eosinophils Absolute: 0 10*3/uL (ref 0.0–0.7)
Eosinophils Relative: 0 % (ref 0–5)
HCT: 43.6 % (ref 36.0–46.0)
Hemoglobin: 14.9 g/dL (ref 12.0–15.0)
Lymphocytes Relative: 8 % — ABNORMAL LOW (ref 12–46)
Lymphs Abs: 1 10*3/uL (ref 0.7–4.0)
MCH: 31.3 pg (ref 26.0–34.0)
MCHC: 34.2 g/dL (ref 30.0–36.0)
MCV: 91.6 fL (ref 78.0–100.0)
Monocytes Absolute: 0.6 10*3/uL (ref 0.1–1.0)
Monocytes Relative: 5 % (ref 3–12)
Neutro Abs: 11.4 10*3/uL — ABNORMAL HIGH (ref 1.7–7.7)
Neutrophils Relative %: 87 % — ABNORMAL HIGH (ref 43–77)
Platelets: 257 10*3/uL (ref 150–400)
RBC: 4.76 MIL/uL (ref 3.87–5.11)
RDW: 12.9 % (ref 11.5–15.5)
WBC: 13 10*3/uL — ABNORMAL HIGH (ref 4.0–10.5)

## 2015-03-14 LAB — CK
Total CK: 47034 U/L — ABNORMAL HIGH (ref 38–234)
Total CK: 39143 U/L — ABNORMAL HIGH (ref 38–234)

## 2015-03-14 LAB — ETHANOL: Alcohol, Ethyl (B): <5 mg/dL (ref <5)

## 2015-03-14 MED ORDER — ACETAMINOPHEN 325 MG PO TABS
650.0000 mg | ORAL_TABLET | Freq: Four times a day (QID) | ORAL | Status: DC | PRN
  Administered 2015-03-15: 650 mg via ORAL
  Filled 2015-03-14: qty 2

## 2015-03-14 MED ORDER — ENOXAPARIN SODIUM 40 MG/0.4ML ~~LOC~~ SOLN
40.0000 mg | SUBCUTANEOUS | Status: DC
  Administered 2015-03-14 – 2015-03-15 (×2): 40 mg via SUBCUTANEOUS
  Filled 2015-03-14 (×3): qty 0.4

## 2015-03-14 MED ORDER — SODIUM CHLORIDE 0.9 % IV BOLUS (SEPSIS)
1000.0000 mL | Freq: Once | INTRAVENOUS | Status: AC
  Administered 2015-03-14: 1000 mL via INTRAVENOUS

## 2015-03-14 MED ORDER — TRAZODONE HCL 50 MG PO TABS: 50.0000 mg | ORAL_TABLET | Freq: Every evening | ORAL | Status: DC | PRN

## 2015-03-14 MED ORDER — LAMOTRIGINE 25 MG PO TABS
50.0000 mg | ORAL_TABLET | Freq: Two times a day (BID) | ORAL | Status: DC
  Administered 2015-03-14 – 2015-03-15 (×3): 50 mg via ORAL
  Filled 2015-03-14 (×5): qty 2

## 2015-03-14 MED ORDER — ACETAMINOPHEN 650 MG RE SUPP
650.0000 mg | Freq: Four times a day (QID) | RECTAL | Status: DC | PRN
Start: 2015-03-14 — End: 2015-03-16

## 2015-03-14 MED ORDER — SODIUM CHLORIDE 0.9 % IV SOLN
INTRAVENOUS | Status: DC
  Administered 2015-03-14: 14:00:00 via INTRAVENOUS

## 2015-03-14 MED ORDER — ONDANSETRON HCL 4 MG PO TABS: 4.0000 mg | ORAL_TABLET | Freq: Four times a day (QID) | ORAL | Status: DC | PRN

## 2015-03-14 MED ORDER — SODIUM CHLORIDE 0.9 % IV SOLN
INTRAVENOUS | Status: DC
Start: 2015-03-14 — End: 2015-03-16
  Administered 2015-03-15 – 2015-03-16 (×4): via INTRAVENOUS

## 2015-03-14 MED ORDER — ONDANSETRON HCL 4 MG/2ML IJ SOLN: 4.0000 mg | Freq: Four times a day (QID) | INTRAMUSCULAR | Status: DC | PRN

## 2015-03-14 MED ORDER — FLUOXETINE HCL 10 MG PO CAPS
10.0000 mg | ORAL_CAPSULE | Freq: Every day | ORAL | Status: DC
  Administered 2015-03-14 – 2015-03-16 (×3): 10 mg via ORAL
  Filled 2015-03-14 (×4): qty 1
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
## 2015-03-14 NOTE — ED Notes (Signed)
[facility] called and said this patient cannot go upstairs until 1506 is clean. Will call when it is ready.

## 2015-03-14 NOTE — ED Notes (Signed)
Pt. Reminded to collect urine for U/A, verbalized understanding. 

## 2015-03-14 NOTE — ED Provider Notes (Signed)
Care assumed at shift change from [facility]. Pt here with ?seizure last night stating that she can't walk, also now reports auditory hallucinations stating that she hears voices telling her that she'll never walk again. Pt has seen Dr. Terrace Arabia of [facility] in the past, neg EEG and neg MRI. Plan is to have psych eval this AM, and if they don't place pt then will need to potentially get neuro to officially come see pt (they were consulted last night but felt it was a psych issue) and potentially admit.    Physical Exam  BP 113/71 mmHg  Pulse 77  Temp(Src) 99 F (37.2 C) (Oral)  Resp 19  SpO2 99%  Physical Exam Gen: afebrile, VSS, NAD, resting comfortably, arouseable, watching TV with controller in R hand HEENT: EOMI, MMM Resp: no resp distress CV: rate WNL Abd: appearance normal, nondistended MsK: moving all extremities with ease, FROM in all joints, but refusing/unable to walk Neuro: A&O x4, strength and sensation grossly intact in all extremities   ED Course  Procedures Results for orders placed or performed during the hospital encounter of 03/13/15  Urinalysis, Routine w reflex microscopic (not at [facility])  Result Value Ref Range   Color, Urine AMBER (A) YELLOW   APPearance CLOUDY (A) CLEAR   Specific Gravity, Urine 1.030 1.005 - 1.030   pH 6.5 5.0 - 8.0   Glucose, UA NEGATIVE NEGATIVE mg/dL   Hgb urine dipstick LARGE (A) NEGATIVE   Bilirubin Urine SMALL (A) NEGATIVE   Ketones, ur 40 (A) NEGATIVE mg/dL   Protein, ur 161 (A) NEGATIVE mg/dL   Urobilinogen, UA 0.2 0.0 - 1.0 mg/dL   Nitrite NEGATIVE NEGATIVE   Leukocytes, UA NEGATIVE NEGATIVE  Urine rapid drug screen (hosp performed)  Result Value Ref Range   Opiates NONE DETECTED NONE DETECTED   Cocaine NONE DETECTED NONE DETECTED   Benzodiazepines NONE DETECTED NONE DETECTED   Amphetamines NONE DETECTED NONE DETECTED   Tetrahydrocannabinol NONE DETECTED NONE DETECTED   Barbiturates NONE DETECTED NONE DETECTED  CBC  with Differential  Result Value Ref Range   WBC 13.0 (H) 4.0 - 10.5 K/uL   RBC 4.76 3.87 - 5.11 MIL/uL   Hemoglobin 14.9 12.0 - 15.0 g/dL   HCT 09.6 04.5 - 40.9 %   MCV 91.6 78.0 - 100.0 fL   MCH 31.3 26.0 - 34.0 pg   MCHC 34.2 30.0 - 36.0 g/dL   RDW 81.1 91.4 - 78.2 %   Platelets 257 150 - 400 K/uL   Neutrophils Relative % 87 (H) 43 - 77 %   Neutro Abs 11.4 (H) 1.7 - 7.7 K/uL   Lymphocytes Relative 8 (L) 12 - 46 %   Lymphs Abs 1.0 0.7 - 4.0 K/uL   Monocytes Relative 5 3 - 12 %   Monocytes Absolute 0.6 0.1 - 1.0 K/uL   Eosinophils Relative 0 0 - 5 %   Eosinophils Absolute 0.0 0.0 - 0.7 K/uL   Basophils Relative 0 0 - 1 %   Basophils Absolute 0.0 0.0 - 0.1 K/uL  Basic metabolic panel  Result Value Ref Range   Sodium 139 135 - 145 mmol/L   Potassium 3.7 3.5 - 5.1 mmol/L   Chloride 104 101 - 111 mmol/L   CO2 23 22 - 32 mmol/L   Glucose, Bld 109 (H) 65 - 99 mg/dL   BUN 11 6 - 20 mg/dL   Creatinine, Ser 9.56 0.44 - 1.00 mg/dL   Calcium 8.8 (L) 8.9 -  10.3 mg/dL   GFR calc non Af Amer >60 >60 mL/min   GFR calc Af Amer >60 >60 mL/min   Anion gap 12 5 - 15  Protime-INR  Result Value Ref Range   Prothrombin Time 13.6 11.6 - 15.2 seconds   INR 1.02 0.00 - 1.49  Ethanol  Result Value Ref Range   Alcohol, Ethyl (B) <5 <5 mg/dL  CK  Result Value Ref Range   [place] [address] (H) 38 - 234 U/L  Urine microscopic-add on  Result Value Ref Range   Squamous Epithelial / LPF RARE RARE   WBC, UA 0-2 <3 WBC/hpf   RBC / HPF 0-2 <3 RBC/hpf   Bacteria, UA FEW (A) RARE   Urine-Other MUCOUS PRESENT   CK  Result Value Ref Range   [place] [address] (H) 38 - 234 U/L   Ct Head Wo Contrast  03/14/2015   CLINICAL DATA:  Status post fall after seizure. Neck pain. Concern for head injury. Initial encounter.  EXAM: CT HEAD WITHOUT CONTRAST  CT CERVICAL SPINE WITHOUT CONTRAST  TECHNIQUE: Multidetector CT imaging of the head and cervical spine was performed following the standard protocol without  intravenous contrast. Multiplanar CT image reconstructions of the cervical spine were also generated.  COMPARISON:  CT of the head performed 07/30/2013, and MRI of the brain performed 01/13/2015  FINDINGS: CT HEAD FINDINGS  There is no evidence of acute infarction, mass lesion, or intra- or extra-axial hemorrhage on CT.  The posterior fossa, including the cerebellum, brainstem and fourth ventricle, is within normal limits. The third and lateral ventricles, and basal ganglia are unremarkable in appearance. The cerebral hemispheres are symmetric in appearance, with normal gray-white differentiation. No mass effect or midline shift is seen.  There is no evidence of fracture; visualized osseous structures are unremarkable in appearance. The orbits are within normal limits. The paranasal sinuses and mastoid air cells are well-aerated. No significant soft tissue abnormalities are seen.  CT CERVICAL SPINE FINDINGS  There is no evidence of fracture or subluxation. Vertebral bodies demonstrate normal height and alignment. Intervertebral disc spaces are preserved. Prevertebral soft tissues are within normal limits. The visualized neural foramina are grossly unremarkable.  The thyroid gland is unremarkable in appearance. The visualized lung apices are clear. No significant soft tissue abnormalities are seen.  IMPRESSION: 1. No evidence of traumatic intracranial injury or fracture. 2. No evidence of fracture or subluxation along the cervical spine.   Electronically Signed   By: Roanna Raider M.D.   On: 03/14/2015 01:28   Ct Cervical Spine Wo Contrast  03/14/2015   CLINICAL DATA:  Status post fall after seizure. Neck pain. Concern for head injury. Initial encounter.  EXAM: CT HEAD WITHOUT CONTRAST  CT CERVICAL SPINE WITHOUT CONTRAST  TECHNIQUE: Multidetector CT imaging of the head and cervical spine was performed following the standard protocol without intravenous contrast. Multiplanar CT image reconstructions of the cervical  spine were also generated.  COMPARISON:  CT of the head performed 07/30/2013, and MRI of the brain performed 01/13/2015  FINDINGS: CT HEAD FINDINGS  There is no evidence of acute infarction, mass lesion, or intra- or extra-axial hemorrhage on CT.  The posterior fossa, including the cerebellum, brainstem and fourth ventricle, is within normal limits. The third and lateral ventricles, and basal ganglia are unremarkable in appearance. The cerebral hemispheres are symmetric in appearance, with normal gray-white differentiation. No mass effect or midline shift is seen.  There is no evidence of fracture; visualized osseous structures  are unremarkable in appearance. The orbits are within normal limits. The paranasal sinuses and mastoid air cells are well-aerated. No significant soft tissue abnormalities are seen.  CT CERVICAL SPINE FINDINGS  There is no evidence of fracture or subluxation. Vertebral bodies demonstrate normal height and alignment. Intervertebral disc spaces are preserved. Prevertebral soft tissues are within normal limits. The visualized neural foramina are grossly unremarkable.  The thyroid gland is unremarkable in appearance. The visualized lung apices are clear. No significant soft tissue abnormalities are seen.  IMPRESSION: 1. No evidence of traumatic intracranial injury or fracture. 2. No evidence of fracture or subluxation along the cervical spine.   Electronically Signed   By: Roanna Raider M.D.   On: 03/14/2015 01:28   [facility]- MRI brain w/wo contrast NEUROIMAGING REPORT  STUDY DATE: 01/13/15 PATIENT NAME: Carol Fowler DOB: 17-Dec-1970 MRN: [id number]  ORDERING CLINICIAN: Levert Feinstein, MD PhD  CLINICAL HISTORY: 44 year old female with seizure.  EXAM: MRI brain (with and without)  TECHNIQUE: MRI of the brain with and without contrast was obtained utilizing 5 mm axial slices with T1, T2, T2 flair, T2 star gradient echo and diffusion weighted views. T1 sagittal, T2  coronal and postcontrast views in the axial and coronal plane were obtained. CONTRAST: 17ml magnevist  IMAGING SITE: [facility] 3rd Jaisen Wiltrout (1.5 Tesla MRI)   FINDINGS:  No abnormal lesions are seen on diffusion-weighted views to suggest acute ischemia. The cortical sulci, fissures and cisterns are normal in size and appearance. Lateral, third and fourth ventricle are normal in size and appearance. No extra-axial fluid collections are seen. No evidence of mass effect or midline shift.   Trivial periventricular and left frontal subcortical punctate foci of non-specific gliosis. No abnormal lesions are seen on post contrast views.   On coronal views no mesial temporal sclerosis or hippocampal atrophy. On sagittal views the posterior fossa, pituitary gland and corpus callosum are unremarkable. No evidence of intracranial hemorrhage on gradient-echo views. The orbits and their contents, paranasal sinuses and calvarium are unremarkable. Intracranial flow voids are present.   IMPRESSION:  Equivocal MRI brain (with and without) demonstrating: 1. Trivial periventricular and left frontal subcortical punctate foci of non-specific gliosis. 2. No abnormal lesions are seen on post contrast views.  3. No acute findings.  INTERPRETING PHYSICIAN:  Suanne Marker, MD Certified in Neurology, Neurophysiology and Neuroimaging  [facility] [address], [address] [place], [place] [address] [phone]    Meds ordered this encounter  Medications  . sodium chloride 0.9 % bolus 1,000 mL    Sig:   . sodium chloride 0.9 % bolus 1,000 mL    Sig:      MDM:   ICD-9-CM ICD-10-CM   1. Non-traumatic rhabdomyolysis 728.88 M62.82   2. Paresthesias 782.0 R20.2   3. Weakness 780.79 R53.1     8:02 AM- moving all extremities, denies Auditory hallucinations at this time, but states that this happens occasionally and occurred yesterday when she was on the floor at home after her  episode. Continues to state she can't walk, but reports that now she's able to feel equally in all areas of her extremities. Will await psych consultation and further recommendations.  9:58 AM CK resulting just now (drawn at midnight), showing level of 47,034. Unclear if this could be an error or is accurate. Will give fluids and recheck level. If continues to be elevated, will admit. Still awaiting psych consult.   10:56 AM Psych in to see pt,  cleared pt from their standpoint. Repeat CK not yet obtained but given her CK previously and continued inability to walk, will admit. Pt was able to stand for several seconds with nursing staff, and states that some of her symptoms have improved (paresthesias and weakness).   11:34 [place] [address]. Dr. Blake Divine returning page, will admit for hydration. Please see her notes for further documentation of care.   Carol Matura Camprubi-Soms, PA-C 03/14/15 1135  Jerelyn Scott, MD 03/17/15 [phone]

## 2015-03-14 NOTE — ED Provider Notes (Signed)
H/o seizures - lamictal Woke on floor by the bed, unable to ambulate secondary to extremity weakness Crawled to front door, signaled neighbor for help CT head, c-spine - pending Plan: consult neurology - weakness, no deficits.   Re-evaluation:  She appears shaky. Upper and lower extremity bruising to knees, shins, elbows, appears new. Moves all extremities on command but continues to feel she cannot walk.   Discussed that she has never had similar symptoms. When asked if she was hearing or seeing things that are not there, she states "yes, i hear voices. A female voice that tells me I'm never going to walk again." She reports that at times she has her eyes open but can't see anything. She talks about dreams but does not give details and can't say is she is awake when having them. She reports things are missing in her house: she can't find her medications and hasn't taken them since Saturday night (yesterday); she states her wallet is missing and somewhere in the house. She lives by herself but reports she has a daughter that sometimes visits.   DDx: neurologic event (sz with prolonged post-ictal phase??) vs psychiatric origin of symptoms. Will discuss with neurology and plan to admit based on recommendation.  Discussed with neurology, Dr. Roseanne Reno, who does not feel symptoms are neurologic and recommends psychiatric evaluation. TTS consultation ordered.   Per TTS Consultation, patient will be evaluated by psychiatry in the morning.   Elpidio Anis, PA-C 03/14/15 [id number]  Jerelyn Scott, MD 03/14/15 [phone]

## 2015-03-14 NOTE — ED Notes (Signed)
Pt. Encouraged to walk to the bathroom  Or use a  Commode , pt. Became upset and told this Nurse that she is unable to walk for sometime now  due to weakness on her bil. LE. PA notified.

## 2015-03-14 NOTE — ED Notes (Signed)
Pt requested to attempt to ambulate. Stood patient with assist x 2. Immediately sat back on stretcher, stated, "I can't do it." When asked why she could not stand reported NOT d/t pain or weakness, I just can't do it." Denies numbness or tingling to LEs.

## 2015-03-14 NOTE — Consult Note (Addendum)
[facility] Consult   Reason for Consult: hearing voices and seeing things Referring Physician: EDP Patient Identification: Carol Fowler MRN:  [id number] Principal Diagnosis: Major depressive disorder, recurrent episode, moderate degree Diagnosis:   Patient Active Problem List   Diagnosis Date Noted  . Major depressive disorder, recurrent episode, moderate degree [F33.1] 03/14/2015    Priority: High  . Anxiety disorder [F41.9] 03/14/2015    Priority: High  . Post-ictal state [R56.9] 03/14/2015    Priority: High  . Seizures [R56.9] 10/01/2014    Total Time spent with patient: 45 minutes  Subjective:   Carol Fowler is a 44 y.o. female patient admitted after a seizure episode.  HPI: Carol Fowler is an 44 y.o. female with history of depression and seizure disorder. Patient was brought to the ED following a seizure episode. She reports that she was feeling week yesterday and fell out of her bed around 3 am Saturday morning, she reports passing urine on herself, felt weak, had blurred vision and unable to speak. Patient denies prior history of psychosis but reports visual and auditory hallucinations following the seizure episode yesterday. She reports that "I saw a person sitting in the chair and they were laughing telling me I would never walk again, I have never hallucinate before.'' Today, patient reports that she worry about bilateral leg weakness and denies depression, psychosis and delusional thinking. Patient denies drugs and alcohol abuse. Patient follows a neurologist at [facility] neurological center, Dr. Evelena Leyden.  HPI Elements:   Location:  psychosis.  Past Medical History:  Past Medical History  Diagnosis Date  . Syncope     Past Surgical History  Procedure Laterality Date  . Caesarian  1995   Family History: History reviewed. No pertinent family history. Social History:  History  Alcohol Use  . 0.0 oz/week  . 0 Standard drinks or equivalent per week     Comment: rare     History  Drug Use No    Social History   Social History  . Marital Status: Single    Spouse Name: N/A  . Number of Children: 1  . Years of Education: hs grad   Occupational History  . Fork lift driver  [employer]   Social History Main Topics  . Smoking status: Current Some Day Smoker -- 0.25 packs/day    Types: Cigarettes  . Smokeless tobacco: None     Comment: smokes teo to three a day and then goes several days without  . Alcohol Use: 0.0 oz/week    0 Standard drinks or equivalent per week     Comment: rare  . Drug Use: No  . Sexual Activity: Not Asked   Other Topics Concern  . None   Social History Narrative   Caffeine 1-2 cups daily.   Additional Social History:    Pain Medications: Pt denies abuse  Prescriptions: Pt denies abuse  Over the Counter: Pt denies abuse  History of alcohol / drug use?: No history of alcohol / drug abuse                     Allergies:  No Known Allergies  Labs:  Results for orders placed or performed during the hospital encounter of 03/13/15 (from the past 48 hour(s))  CBC with Differential     Status: Abnormal   Collection Time: 03/14/15 12:12 AM  Result Value Ref Range   WBC 13.0 (H) 4.0 - 10.5 K/uL   RBC 4.76 3.87 -  5.11 MIL/uL   Hemoglobin 14.9 12.0 - 15.0 g/dL   HCT 43.6 36.0 - 46.0 %   MCV 91.6 78.0 - 100.0 fL   MCH 31.3 26.0 - 34.0 pg   MCHC 34.2 30.0 - 36.0 g/dL   RDW 12.9 11.5 - 15.5 %   Platelets 257 150 - 400 K/uL   Neutrophils Relative % 87 (H) 43 - 77 %   Neutro Abs 11.4 (H) 1.7 - 7.7 K/uL   Lymphocytes Relative 8 (L) 12 - 46 %   Lymphs Abs 1.0 0.7 - 4.0 K/uL   Monocytes Relative 5 3 - 12 %   Monocytes Absolute 0.6 0.1 - 1.0 K/uL   Eosinophils Relative 0 0 - 5 %   Eosinophils Absolute 0.0 0.0 - 0.7 K/uL   Basophils Relative 0 0 - 1 %   Basophils Absolute 0.0 0.0 - 0.1 K/uL  Basic metabolic panel     Status: Abnormal   Collection Time: 03/14/15 12:12 AM  Result Value Ref  Range   Sodium 139 135 - 145 mmol/L   Potassium 3.7 3.5 - 5.1 mmol/L   Chloride 104 101 - 111 mmol/L   CO2 23 22 - 32 mmol/L   Glucose, Bld 109 (H) 65 - 99 mg/dL   BUN 11 6 - 20 mg/dL   Creatinine, Ser 0.87 0.44 - 1.00 mg/dL   Calcium 8.8 (L) 8.9 - 10.3 mg/dL   GFR calc non Af Amer >60 >60 mL/min   GFR calc Af Amer >60 >60 mL/min    Comment: (NOTE) The eGFR has been calculated using the CKD EPI equation. This calculation has not been validated in all clinical situations. eGFR's persistently <60 mL/min signify possible Chronic Kidney Disease.    Anion gap 12 5 - 15  Protime-INR     Status: None   Collection Time: 03/14/15 12:12 AM  Result Value Ref Range   Prothrombin Time 13.6 11.6 - 15.2 seconds   INR 1.02 0.00 - 1.49  CK     Status: Abnormal   Collection Time: 03/14/15 12:17 AM  Result Value Ref Range   [place] CK [address] (H) 38 - 234 U/L    Comment: RESULTS CONFIRMED BY MANUAL DILUTION  Ethanol     Status: None   Collection Time: 03/14/15  1:36 AM  Result Value Ref Range   Alcohol, Ethyl (B) <5 <5 mg/dL    Comment:        LOWEST DETECTABLE LIMIT FOR SERUM ALCOHOL IS 5 mg/dL FOR MEDICAL PURPOSES ONLY   Urinalysis, Routine w reflex microscopic (not at [facility])     Status: Abnormal   Collection Time: 03/14/15  1:45 AM  Result Value Ref Range   Color, Urine AMBER (A) YELLOW    Comment: BIOCHEMICALS MAY BE AFFECTED BY COLOR   APPearance CLOUDY (A) CLEAR   Specific Gravity, Urine 1.030 1.005 - 1.030   pH 6.5 5.0 - 8.0   Glucose, UA NEGATIVE NEGATIVE mg/dL   Hgb urine dipstick LARGE (A) NEGATIVE   Bilirubin Urine SMALL (A) NEGATIVE   Ketones, ur 40 (A) NEGATIVE mg/dL   Protein, ur 100 (A) NEGATIVE mg/dL   Urobilinogen, UA 0.2 0.0 - 1.0 mg/dL   Nitrite NEGATIVE NEGATIVE   Leukocytes, UA NEGATIVE NEGATIVE  Urine rapid drug screen (hosp performed)     Status: None   Collection Time: 03/14/15  1:45 AM  Result Value Ref Range   Opiates NONE DETECTED NONE DETECTED   Cocaine  NONE DETECTED NONE DETECTED  Benzodiazepines NONE DETECTED NONE DETECTED   Amphetamines NONE DETECTED NONE DETECTED   Tetrahydrocannabinol NONE DETECTED NONE DETECTED   Barbiturates NONE DETECTED NONE DETECTED    Comment:        DRUG SCREEN FOR MEDICAL PURPOSES ONLY.  IF CONFIRMATION IS NEEDED FOR ANY PURPOSE, NOTIFY LAB WITHIN 5 DAYS.        LOWEST DETECTABLE LIMITS FOR URINE DRUG SCREEN Drug Class       Cutoff (ng/mL) Amphetamine      1000 Barbiturate      200 Benzodiazepine   446 Tricyclics       286 Opiates          300 Cocaine          300 THC              50   Urine microscopic-add on     Status: Abnormal   Collection Time: 03/14/15  1:45 AM  Result Value Ref Range   Squamous Epithelial / LPF RARE RARE   WBC, UA 0-2 <3 WBC/hpf   RBC / HPF 0-2 <3 RBC/hpf   Bacteria, UA FEW (A) RARE   Urine-Other MUCOUS PRESENT   CK     Status: Abnormal   Collection Time: 03/14/15 10:12 AM  Result Value Ref Range   [place] CK [address] (H) 38 - 234 U/L    Comment: RESULTS CONFIRMED BY MANUAL DILUTION    Vitals: Blood pressure 123/80, pulse 80, temperature 99 F (37.2 C), temperature source Oral, resp. rate 18, SpO2 100 %.  Risk to Self: Suicidal Ideation: No Suicidal Intent: No Is patient at risk for suicide?: No Suicidal Plan?: No Access to Means: No What has been your use of drugs/alcohol within the last 12 months?: No alcohol or drug use reported.  How many times?: 1 Other Self Harm Risks: No self harm risk identified at this time.  Triggers for Past Attempts: Unknown Intentional Self Injurious Behavior: None Risk to Others: Homicidal Ideation: No Thoughts of Harm to Others: No Current Homicidal Intent: No Current Homicidal Plan: No Access to Homicidal Means: No Identified Victim: N/A History of harm to others?: No Assessment of Violence: On admission Violent Behavior Description: No violent behaviors observed. Pt is calm and cooperative at this time.  Does patient have  access to weapons?: No Criminal Charges Pending?: No Does patient have a court date: Yes Court Date:  Dietitian accident" ) Prior Inpatient Therapy: Prior Inpatient Therapy: Yes Prior Therapy Dates: 2011 Prior Therapy Facilty/Provider(s): [facility]  Reason for Treatment: Suicide attempt  Prior Outpatient Therapy: Prior Outpatient Therapy: Yes Prior Therapy Dates: 2011 Prior Therapy Facilty/Provider(s): PT unable to recall at this time.  Reason for Treatment: Depression  Does patient have an ACCT team?: No Does patient have Intensive In-House Services?  : No Does patient have [facility] services? : No Does patient have [facility] services?: No  No current facility-administered medications for this encounter.   Current Outpatient Prescriptions  Medication Sig Dispense Refill  . FLUoxetine (PROZAC) 20 MG capsule Take 20 mg by mouth daily.   3  . lamoTRIgine (LAMICTAL) 100 MG tablet One and 1/2 tab po bid (Patient taking differently: Take 50 mg by mouth 2 (two) times daily. One and 1/2 tab po bid) 270 tablet 3  . Multiple Vitamins-Minerals (MULTIVITAMIN WITH MINERALS) tablet Take 1 tablet by mouth daily.    . cyanocobalamin 500 MCG tablet Take 500 mcg by mouth daily.    . ferrous gluconate (FERGON) 324 MG tablet  3  . fexofenadine (ALLEGRA ALLERGY) 180 MG tablet Take 1 tablet (180 mg total) by mouth daily. (Patient taking differently: Take 180 mg by mouth as needed. ) 30 tablet 0  . traZODone (DESYREL) 50 MG tablet 50 mg at bedtime as needed for sleep.   0    Musculoskeletal: Strength & Muscle Tone: weakness lower extremety Gait & Station: unsteady Patient leans: Front  Psychiatric Specialty Exam: Physical Exam  Psychiatric: Judgment and thought content normal. Her mood appears anxious. Her speech is delayed. She is withdrawn. Cognition and memory are normal.    Review of Systems  Constitutional: Positive for malaise/fatigue.  HENT: Negative.   Eyes: Negative.   Respiratory: Negative.    Cardiovascular: Negative.   Gastrointestinal: Negative.   Genitourinary: Negative.   Musculoskeletal: Positive for myalgias.  Skin: Negative.   Neurological: Positive for seizures and weakness.  Endo/Heme/Allergies: Negative.   Psychiatric/Behavioral: Negative.     Blood pressure 123/80, pulse 80, temperature 99 F (37.2 C), temperature source Oral, resp. rate 18, SpO2 100 %.There is no weight on file to calculate BMI.  General Appearance: Casual  Eye Contact::  Good  Speech:  Clear and Coherent  Volume:  Normal  Mood:  Dysphoric  Affect:  Constricted  Thought Process:  Goal Directed  Orientation:  Full (Time, Place, and Person)  Thought Content:  Negative  Suicidal Thoughts:  No  Homicidal Thoughts:  No  Memory:  Immediate;   Good Recent;   Good Remote;   Good  Judgement:  Fair  Insight:  Fair  Psychomotor Activity:  Decreased  Concentration:  Good  Recall:  Good  Fund of Knowledge:Good  Language: Good  Akathisia:  No  Handed:  Right  AIMS (if indicated):     Assets:  Communication Skills Desire for Improvement  ADL's:  Intact  Cognition: WNL  Sleep:   fair   Medical Decision Making: Established Problem, Stable/Improving (1), Review or order clinical lab tests (1), Review of Medication Regimen & Side Effects (2) and Review of New Medication or Change in Dosage (2)  Treatment Plan Summary: Daily contact with patient to assess and evaluate symptoms and progress in treatment.  Medication management: -Lamictal 28m bid for seizure -Prozac 227mdaily for depression -Trazodone 5065mhs prn insomnia.  Plan:  - No evidence of imminent risk to self or others at present.   -Patient does not meet criteria for psychiatric inpatient admission. - Needs Neurology evaluation for seizure and lower extremities weakness, patient seems to have had post-ictal psychosis. -Re-start antidepressant.  Disposition: Patient has been cleared by psychiatric service.  Josua Ferrebee,  Ivannia Willhelm,MD 03/14/2015 11:27 AM

## 2015-03-15 DIAGNOSIS — F331 Major depressive disorder, recurrent, moderate: Secondary | ICD-10-CM

## 2015-03-15 LAB — BASIC METABOLIC PANEL
Anion gap: 4 — ABNORMAL LOW (ref 5–15)
CALCIUM: 7.8 mg/dL — AB (ref 8.9–10.3)
CO2: 24 mmol/L (ref 22–32)
CREATININE: 0.65 mg/dL (ref 0.44–1.00)
Chloride: 107 mmol/L (ref 101–111)
GFR calc non Af Amer: >60 mL/min (ref >60)
Glucose, Bld: 94 mg/dL (ref 65–99)
Potassium: 3.5 mmol/L (ref 3.5–5.1)
Sodium: 135 mmol/L (ref 135–145)

## 2015-03-15 LAB — CBC
HCT: 40.4 % (ref 36.0–46.0)
Hemoglobin: 13.2 g/dL (ref 12.0–15.0)
MCH: 30.9 pg (ref 26.0–34.0)
MCHC: 32.7 g/dL (ref 30.0–36.0)
MCV: 94.6 fL (ref 78.0–100.0)
PLATELETS: 191 10*3/uL (ref 150–400)
RBC: 4.27 MIL/uL (ref 3.87–5.11)
RDW: 13.1 % (ref 11.5–15.5)
WBC: 6.5 10*3/uL (ref 4.0–10.5)

## 2015-03-15 LAB — CK: Total CK: 30288 U/L — ABNORMAL HIGH (ref 38–234)

## 2015-03-15 MED ORDER — LAMOTRIGINE 200 MG PO TABS
200.0000 mg | ORAL_TABLET | Freq: Every day | ORAL | Status: DC
  Filled 2015-03-15: qty 1

## 2015-03-15 MED ORDER — LAMOTRIGINE 150 MG PO TABS
150.0000 mg | ORAL_TABLET | Freq: Every day | ORAL | Status: DC
  Administered 2015-03-15: 150 mg via ORAL
  Filled 2015-03-15 (×2): qty 1

## 2015-03-15 MED ORDER — LAMOTRIGINE 150 MG PO TABS
150.0000 mg | ORAL_TABLET | Freq: Every morning | ORAL | Status: DC
  Administered 2015-03-15 – 2015-03-16 (×2): 150 mg via ORAL
  Filled 2015-03-15 (×2): qty 1

## 2015-03-15 NOTE — Progress Notes (Signed)
[facility] PROGRESS NOTE  Carol Fowler UUV:[id number] DOB: [birth year] DOA: 03/13/2015 PCP: Ledon Snare, MD  Assessment/Plan: Principal Problem:   Major depressive disorder, recurrent episode, moderate degree Active Problems:   Anxiety disorder   Post-ictal state   Rhabdomyolysis    Seizures: She reports missing her medications on Friday and yesterday and doesn't remember where her lamictal was at home.  She missed her appt with her neurologist on Friday.   Continue Lamictal 150 mg twice a day and ativan prn for seizure.  Seizure precautions.  No further workup indicated at this time   Rhabdomyolysis: CK still greater than 30,000 Probably from being on the floor.  Continue IV fluids, PT/OT evaluation   Anxiety and depression: Appreciate psychiatry recommendations. Cleared by psychiatry   Leukocytosis: Afebrile, ua negative and no upper respiratory symptoms,.  Probably reactive.  Monitor.    Code Status:      Code Status Orders        Start     Ordered   03/14/15 1342  Full code   Continuous     03/14/15 1341     Family Communication: family updated about patient's clinical progress Disposition Plan: Anticipate discharge tomorrow CK continues to improve   Brief narrative: Carol Fowler is a 44 y.o. female with h/o seizures, and depression, presents to ED BY EMS, after an episode of seizures yesterday , unwitnessed , followed by a fall, after which she reports she couldn't walk and had to drag herself to the neighbor who called EMS and brought her to ED. Here she reports being sore all over and bruising of her legs, . She denies any other complaints. Neurology was consulted by EDP , who recommended psychiatry . Psychiatry cleared her and she was referred to medical service for admission for further eval. Her lab work revealed elevated wbc count, and elevated CK levels. Her UA is negative for infection. CT head and cervical spine did nto  reveal any acute fracture or injury.  She will be admitted for rhabdomyolysis. Rule out  Consultants:  none  Procedures:  None  Antibiotics: Anti-infectives    None         HPI/Subjective: Patient denies any chest pain shortness of breath, still complaining of generalized weakness  Objective: Filed Vitals:   03/14/15 1308 03/14/15 1349 03/14/15 2129 03/15/15 0539  BP: 135/87 137/86 127/86 123/85  Pulse: 91 87 78 72  Temp: 98.2 F (36.8 C) 98.5 F (36.9 C) 98.4 F (36.9 C) 98.3 F (36.8 C)  TempSrc: Oral Oral Oral Oral  Resp: Height:   (1.6 m)    Weight:  83.8 kg (184 lb 11.9 oz)    SpO2: 99% 100% 98% 100%    Intake/Output Summary (Last 24 hours) at 03/15/15 1048 Last data filed at 03/15/15 0754  Gross per 24 hour  Intake 1017.5 ml  Output      0 ml  Net 1017.5 ml    Exam:  General: No acute respiratory distress Lungs: Clear to auscultation bilaterally without wheezes or crackles Cardiovascular: Regular rate and rhythm without murmur gallop or rub normal S1 and S2 Abdomen: Nontender, nondistended, soft, bowel sounds positive, no rebound, no ascites, no appreciable mass Extremities: No significant cyanosis, clubbing, or edema bilateral lower extremities     Data Review   Micro Results No results found for this or any previous visit (from the past 240 hour(s)).  Radiology Reports Ct Head Wo  Contrast  03/14/2015   CLINICAL DATA:  Status post fall after seizure. Neck pain. Concern for head injury. Initial encounter.  EXAM: CT HEAD WITHOUT CONTRAST  CT CERVICAL SPINE WITHOUT CONTRAST  TECHNIQUE: Multidetector CT imaging of the head and cervical spine was performed following the standard protocol without intravenous contrast. Multiplanar CT image reconstructions of the cervical spine were also generated.  COMPARISON:  CT of the head performed 07/30/2013, and MRI of the brain performed 01/13/2015  FINDINGS: CT HEAD FINDINGS  There is no  evidence of acute infarction, mass lesion, or intra- or extra-axial hemorrhage on CT.  The posterior fossa, including the cerebellum, brainstem and fourth ventricle, is within normal limits. The third and lateral ventricles, and basal ganglia are unremarkable in appearance. The cerebral hemispheres are symmetric in appearance, with normal gray-white differentiation. No mass effect or midline shift is seen.  There is no evidence of fracture; visualized osseous structures are unremarkable in appearance. The orbits are within normal limits. The paranasal sinuses and mastoid air cells are well-aerated. No significant soft tissue abnormalities are seen.  CT CERVICAL SPINE FINDINGS  There is no evidence of fracture or subluxation. Vertebral bodies demonstrate normal height and alignment. Intervertebral disc spaces are preserved. Prevertebral soft tissues are within normal limits. The visualized neural foramina are grossly unremarkable.  The thyroid gland is unremarkable in appearance. The visualized lung apices are clear. No significant soft tissue abnormalities are seen.  IMPRESSION: 1. No evidence of traumatic intracranial injury or fracture. 2. No evidence of fracture or subluxation along the cervical spine.   Electronically Signed   By: Roanna Raider M.D.   On: 03/14/2015 01:28   Ct Cervical Spine Wo Contrast  03/14/2015   CLINICAL DATA:  Status post fall after seizure. Neck pain. Concern for head injury. Initial encounter.  EXAM: CT HEAD WITHOUT CONTRAST  CT CERVICAL SPINE WITHOUT CONTRAST  TECHNIQUE: Multidetector CT imaging of the head and cervical spine was performed following the standard protocol without intravenous contrast. Multiplanar CT image reconstructions of the cervical spine were also generated.  COMPARISON:  CT of the head performed 07/30/2013, and MRI of the brain performed 01/13/2015  FINDINGS: CT HEAD FINDINGS  There is no evidence of acute infarction, mass lesion, or intra- or extra-axial  hemorrhage on CT.  The posterior fossa, including the cerebellum, brainstem and fourth ventricle, is within normal limits. The third and lateral ventricles, and basal ganglia are unremarkable in appearance. The cerebral hemispheres are symmetric in appearance, with normal gray-white differentiation. No mass effect or midline shift is seen.  There is no evidence of fracture; visualized osseous structures are unremarkable in appearance. The orbits are within normal limits. The paranasal sinuses and mastoid air cells are well-aerated. No significant soft tissue abnormalities are seen.  CT CERVICAL SPINE FINDINGS  There is no evidence of fracture or subluxation. Vertebral bodies demonstrate normal height and alignment. Intervertebral disc spaces are preserved. Prevertebral soft tissues are within normal limits. The visualized neural foramina are grossly unremarkable.  The thyroid gland is unremarkable in appearance. The visualized lung apices are clear. No significant soft tissue abnormalities are seen.  IMPRESSION: 1. No evidence of traumatic intracranial injury or fracture. 2. No evidence of fracture or subluxation along the cervical spine.   Electronically Signed   By: Roanna Raider M.D.   On: 03/14/2015 01:28     CBC  Recent Labs Lab 03/14/15 0012 03/15/15 0535  WBC 13.0* 6.5  HGB 14.9 13.2  HCT 43.6 40.4  PLT 257 191  MCV 91.6 94.6  MCH 31.3 30.9  MCHC 34.2 32.7  RDW 12.9 13.1  LYMPHSABS 1.0  --   MONOABS 0.6  --   EOSABS 0.0  --   BASOSABS 0.0  --     Chemistries   Recent Labs Lab 03/14/15 0012 03/15/15 0535  NA 139 135  K 3.7 3.5  CL 104 107  CO2 23 24  GLUCOSE 109* 94  BUN 11 <5*  CREATININE 0.87 0.65  CALCIUM 8.8* 7.8*   ------------------------------------------------------------------------------------------------------------------ estimated creatinine clearance is 94 mL/min (by C-G formula based on Cr of  0.65). ------------------------------------------------------------------------------------------------------------------ No results for input(s): HGBA1C in the last 72 hours. ------------------------------------------------------------------------------------------------------------------ No results for input(s): CHOL, HDL, LDLCALC, TRIG, CHOLHDL, LDLDIRECT in the last 72 hours. ------------------------------------------------------------------------------------------------------------------ No results for input(s): TSH, T4TOTAL, T3FREE, THYROIDAB in the last 72 hours.  Invalid input(s): FREET3 ------------------------------------------------------------------------------------------------------------------ No results for input(s): VITAMINB12, FOLATE, FERRITIN, TIBC, IRON, RETICCTPCT in the last 72 hours.  Coagulation profile  Recent Labs Lab 03/14/15 0012  INR 1.02    No results for input(s): DDIMER in the last 72 hours.  Cardiac Enzymes No results for input(s): CKMB, TROPONINI, MYOGLOBIN in the last 168 hours.  Invalid input(s): CK ------------------------------------------------------------------------------------------------------------------ Invalid input(s): POCBNP   CBG: No results for input(s): GLUCAP in the last 168 hours.     Studies: Ct Head Wo Contrast  03/14/2015   CLINICAL DATA:  Status post fall after seizure. Neck pain. Concern for head injury. Initial encounter.  EXAM: CT HEAD WITHOUT CONTRAST  CT CERVICAL SPINE WITHOUT CONTRAST  TECHNIQUE: Multidetector CT imaging of the head and cervical spine was performed following the standard protocol without intravenous contrast. Multiplanar CT image reconstructions of the cervical spine were also generated.  COMPARISON:  CT of the head performed 07/30/2013, and MRI of the brain performed 01/13/2015  FINDINGS: CT HEAD FINDINGS  There is no evidence of acute infarction, mass lesion, or intra- or extra-axial hemorrhage on  CT.  The posterior fossa, including the cerebellum, brainstem and fourth ventricle, is within normal limits. The third and lateral ventricles, and basal ganglia are unremarkable in appearance. The cerebral hemispheres are symmetric in appearance, with normal gray-white differentiation. No mass effect or midline shift is seen.  There is no evidence of fracture; visualized osseous structures are unremarkable in appearance. The orbits are within normal limits. The paranasal sinuses and mastoid air cells are well-aerated. No significant soft tissue abnormalities are seen.  CT CERVICAL SPINE FINDINGS  There is no evidence of fracture or subluxation. Vertebral bodies demonstrate normal height and alignment. Intervertebral disc spaces are preserved. Prevertebral soft tissues are within normal limits. The visualized neural foramina are grossly unremarkable.  The thyroid gland is unremarkable in appearance. The visualized lung apices are clear. No significant soft tissue abnormalities are seen.  IMPRESSION: 1. No evidence of traumatic intracranial injury or fracture. 2. No evidence of fracture or subluxation along the cervical spine.   Electronically Signed   By: Roanna Raider M.D.   On: 03/14/2015 01:28   Ct Cervical Spine Wo Contrast  03/14/2015   CLINICAL DATA:  Status post fall after seizure. Neck pain. Concern for head injury. Initial encounter.  EXAM: CT HEAD WITHOUT CONTRAST  CT CERVICAL SPINE WITHOUT CONTRAST  TECHNIQUE: Multidetector CT imaging of the head and cervical spine was performed following the standard protocol without intravenous contrast. Multiplanar CT image reconstructions of the cervical spine were also generated.  COMPARISON:  CT of the head performed 07/30/2013, and  MRI of the brain performed 01/13/2015  FINDINGS: CT HEAD FINDINGS  There is no evidence of acute infarction, mass lesion, or intra- or extra-axial hemorrhage on CT.  The posterior fossa, including the cerebellum, brainstem and fourth  ventricle, is within normal limits. The third and lateral ventricles, and basal ganglia are unremarkable in appearance. The cerebral hemispheres are symmetric in appearance, with normal gray-white differentiation. No mass effect or midline shift is seen.  There is no evidence of fracture; visualized osseous structures are unremarkable in appearance. The orbits are within normal limits. The paranasal sinuses and mastoid air cells are well-aerated. No significant soft tissue abnormalities are seen.  CT CERVICAL SPINE FINDINGS  There is no evidence of fracture or subluxation. Vertebral bodies demonstrate normal height and alignment. Intervertebral disc spaces are preserved. Prevertebral soft tissues are within normal limits. The visualized neural foramina are grossly unremarkable.  The thyroid gland is unremarkable in appearance. The visualized lung apices are clear. No significant soft tissue abnormalities are seen.  IMPRESSION: 1. No evidence of traumatic intracranial injury or fracture. 2. No evidence of fracture or subluxation along the cervical spine.   Electronically Signed   By: Roanna Raider M.D.   On: 03/14/2015 01:28      No results found for: HGBA1C Lab Results  Component Value Date   CREATININE 0.65 03/15/2015       Scheduled Meds: . enoxaparin (LOVENOX) injection  40 mg Subcutaneous Q24H  . FLUoxetine  10 mg Oral Daily  . lamoTRIgine  150 mg Oral q morning - 10a  . lamoTRIgine  200 mg Oral QHS   Continuous Infusions: . sodium chloride 125 mL/hr at 03/15/15 1024    Principal Problem:   Major depressive disorder, recurrent episode, moderate degree Active Problems:   Anxiety disorder   Post-ictal state   Rhabdomyolysis    Time spent: 45 minutes   [facility]  [facility] Pager [phone]. If 7PM-7AM, please contact night-coverage at [email or web address], password [facility] 03/15/2015, 10:48 AM  LOS: 1 day

## 2015-03-15 NOTE — Evaluation (Addendum)
Physical Therapy Evaluation Patient Details Name: Carol Fowler MRN: [id number] DOB: June 19, 1971 Today's Date: 03/15/2015   History of Present Illness  44 y.o. female with h/o seizures and depression admitted after seizure with unwitnessed fall at home, with rhabdomyolysis.  Clinical Impression  Pt admitted with above diagnosis. Pt currently with functional limitations due to the deficits listed below (see PT Problem List).  Pt will benefit from skilled PT to increase their independence and safety with mobility to allow discharge to the venue listed below.   Pt denies any numbness and tingling however reports pain in bil thighs during mobility, better at rest.  Pt also reports generalized soreness "everywhere" since fall at home.  Pt requiring min assist at this time.  Pt only able to tolerate ambulating around bed in room with RW over to recliner.  Pt has flight of steps to enter her home however states she will have assist available for this upon d/c.     Follow Up Recommendations Home health PT;Supervision for mobility/OOB    Equipment Recommendations  Rolling walker with 5" wheels    Recommendations for Other Services       Precautions / Restrictions Precautions Precautions: Fall Restrictions Weight Bearing Restrictions: No      Mobility  Bed Mobility Overal bed mobility: Modified Independent             General bed mobility comments: increased time  Transfers Overall transfer level: Needs assistance Equipment used: Rolling walker (2 wheeled) Transfers: Sit to/from Stand Sit to Stand: Min assist         General transfer comment: verbal cues for safe technique, assist to rise and control descent, marched in place then returned to sitting for rest break prior to ambulating  Ambulation/Gait Ambulation/Gait assistance: Min assist Ambulation Distance (Feet): 15 Feet Assistive device: Rolling walker (2 wheeled) Gait Pattern/deviations: Step-through  pattern;Decreased stride length;Trunk flexed;Narrow base of support     General Gait Details: verbal cues for safe technique, increased reliance of UEs on RW for support, reports thigh pain limiting distance at this time however was able to ambulate around bed to recliner in room  Stairs            Wheelchair Mobility    Modified Rankin (Stroke Patients Only)       Balance                                             Pertinent Vitals/Pain Pain Assessment: 0-10 Pain Score: 8  Pain Location: bil thighs, also back and UEs Pain Descriptors / Indicators: Shooting Pain Intervention(s): Limited activity within patient's tolerance;Monitored during session;Repositioned    Home Living Family/patient expects to be discharged to:: Private residence Living Arrangements: Alone   Type of Home: Apartment Home Access: Stairs to enter Entrance Stairs-Rails: Right Entrance Stairs-Number of Steps: flight Home Layout: One level Home Equipment: None Additional Comments: reports daughter going to school at [employer] this year    Prior Function Level of Independence: Independent               Hand Dominance        Extremity/Trunk Assessment   Upper Extremity Assessment:  (reports sore UEs however able to take weight through RW)           Lower Extremity Assessment: Generalized weakness (able to move all extremities against gravity)  Communication   Communication: No difficulties  Cognition Arousal/Alertness: Awake/alert Behavior During Therapy: WFL for tasks assessed/performed Overall Cognitive Status: Within Functional Limits for tasks assessed                      General Comments      Exercises        Assessment/Plan    PT Assessment Patient needs continued PT services  PT Diagnosis Difficulty walking;Acute pain   PT Problem List Decreased strength;Decreased activity tolerance;Decreased mobility;Decreased balance;Decreased  knowledge of use of DME;Pain  PT Treatment Interventions DME instruction;Gait training;Balance training;Therapeutic activities;Therapeutic exercise;Functional mobility training;Patient/family education   PT Goals (Current goals can be found in the Care Plan section) Acute Rehab PT Goals PT Goal Formulation: With patient Time For Goal Achievement: 03/22/15 Potential to Achieve Goals: Good    Frequency Min 3X/week   Barriers to discharge        Co-evaluation               End of Session Equipment Utilized During Treatment: Gait belt Activity Tolerance: Patient limited by pain Patient left: in chair;with call bell/phone within reach (aware to use call bell to mobilize for safety)           Time: 1018-1030 PT Time Calculation (min) (ACUTE ONLY): 12 min   Charges:   PT Evaluation $Initial PT Evaluation Tier I: 1 Procedure     PT G Codes:        Anoushka Divito,KATHrine E 03/15/2015, 11:51 AM Zenovia Jarred, PT, DPT 03/15/2015 Pager: [phone]

## 2015-03-16 ENCOUNTER — Ambulatory Visit: Payer: Self-pay | Admitting: Nurse Practitioner

## 2015-03-16 ENCOUNTER — Telehealth: Payer: Self-pay | Admitting: *Deleted

## 2015-03-16 LAB — COMPREHENSIVE METABOLIC PANEL
ALBUMIN: 3.1 g/dL — AB (ref 3.5–5.0)
ALT: 95 U/L — ABNORMAL HIGH (ref 14–54)
ANION GAP: 4 — AB (ref 5–15)
AST: 355 U/L — ABNORMAL HIGH (ref 15–41)
Alkaline Phosphatase: 45 U/L (ref 38–126)
BILIRUBIN TOTAL: 0.6 mg/dL (ref 0.3–1.2)
BUN: 6 mg/dL (ref 6–20)
CHLORIDE: 104 mmol/L (ref 101–111)
CO2: 27 mmol/L (ref 22–32)
Calcium: 8.2 mg/dL — ABNORMAL LOW (ref 8.9–10.3)
Creatinine, Ser: 0.7 mg/dL (ref 0.44–1.00)
GFR calc Af Amer: >60 mL/min (ref >60)
GFR calc non Af Amer: >60 mL/min (ref >60)
GLUCOSE: 98 mg/dL (ref 65–99)
POTASSIUM: 4 mmol/L (ref 3.5–5.1)
SODIUM: 135 mmol/L (ref 135–145)
TOTAL PROTEIN: 6 g/dL — AB (ref 6.5–8.1)

## 2015-03-16 LAB — CK: CK TOTAL: 20147 U/L — AB (ref 38–234)

## 2015-03-16 MED ORDER — LAMOTRIGINE 150 MG PO TABS: 150.0000 mg | ORAL_TABLET | Freq: Two times a day (BID) | ORAL | Status: AC

## 2015-03-16 MED ORDER — TRAZODONE HCL 50 MG PO TABS: 50.0000 mg | ORAL_TABLET | Freq: Every evening | ORAL | Status: DC | PRN

## 2015-03-16 MED ORDER — FLUOXETINE HCL 20 MG PO CAPS: 20.0000 mg | ORAL_CAPSULE | Freq: Every day | ORAL | Status: AC

## 2015-03-16 NOTE — Discharge Instructions (Signed)
No driving for 6 months, or until cleared by neurology

## 2015-03-16 NOTE — Progress Notes (Signed)
Physical Therapy Treatment and Discharge from acute PT Patient Details Name: Carol Fowler MRN: [id number] DOB: [birth year] Today's Date: 03/16/2015    History of Present Illness 44 y.o. female with h/o seizures and depression admitted after seizure with unwitnessed fall at home, with rhabdomyolysis.    PT Comments    Pt mobility much improved today compared to yesterday; pt did not even need RW for support during ambulation.  Pt declined practicing stairs and reports d/c home later today.  Since pt has met goals, will d/c from acute PT at this time.     Follow Up Recommendations  Supervision for mobility/OOB;No PT follow up     Equipment Recommendations  None recommended by PT    Recommendations for Other Services       Precautions / Restrictions Precautions Precautions: Fall Restrictions Weight Bearing Restrictions: No    Mobility  Bed Mobility Overal bed mobility: Modified Independent                Transfers Overall transfer level: Modified independent Equipment used: None Transfers: Sit to/from Stand Sit to Stand: Min guard         General transfer comment: verbal cues for safety. pt tends to stand quickly. Cautioned her to go slower for safety.   Ambulation/Gait Ambulation/Gait assistance: Supervision;Modified independent (Device/Increase time) Ambulation Distance (Feet): 400 Feet Assistive device: None Gait Pattern/deviations: Step-through pattern     General Gait Details: slow pace however improved gait since yesterday, pt did not require UE assist for support however pt pushed IV pole   Stairs Stairs:  (pt declined practicing)          Wheelchair Mobility    Modified Rankin (Stroke Patients Only)       Balance                                    Cognition Arousal/Alertness: Awake/alert Behavior During Therapy: WFL for tasks assessed/performed Overall Cognitive Status: Within Functional Limits for tasks assessed                      Exercises      General Comments        Pertinent Vitals/Pain Pain Assessment: 0-10 Pain Score: 5  Pain Location: bilateral thighs, back, UEs Pain Descriptors / Indicators: Sore Pain Intervention(s): Limited activity within patient's tolerance;Monitored during session    Home Living Family/patient expects to be discharged to:: Private residence Living Arrangements: Alone Available Help at Discharge: Family (states mother can be with her initially) Type of Home: Apartment Home Access: Stairs to enter Entrance Stairs-Rails: Right Home Layout: One level Home Equipment: None Additional Comments: reports daughter going to school at [employer] this year    Prior Function Level of Independence: Independent          PT Goals (current goals can now be found in the care plan section) Acute Rehab PT Goals Patient Stated Goal: home when ready Progress towards PT goals: Goals met/education completed, patient discharged from PT    Frequency       PT Plan Other (comment) (d/c from acute PT)    Co-evaluation             End of Session   Activity Tolerance: Patient tolerated treatment well Patient left: in chair;with call bell/phone within reach     Time: 1124-1132 PT Time Calculation (min) (ACUTE ONLY): 8 min  Charges:  $Gait  Training: 8-22 mins                    G Codes:      Reyhan Moronta,KATHrine E 2015-04-01, 12:13 PM Carmelia Bake, PT, DPT April 01, 2015 Pager: [phone]

## 2015-03-16 NOTE — Discharge Summary (Signed)
Physician Discharge Summary  Carol Fowler MRN: [id number] DOB/AGE: August 24, 1970 44 y.o.  PCP: Carol Rutter, MD   Admit date: 03/13/2015 Discharge date: 03/16/2015  Discharge Diagnoses:     Principal Problem:   Major depressive disorder, recurrent episode, moderate degree Active Problems:   Anxiety disorder   Post-ictal state   Rhabdomyolysis    Follow-up recommendations Follow-up with PCP in 3-5 days , including all  additional recommended appointments as below Follow-up CBC, CMP, CK  in 3-5 days Follow-up with neurology Dr. Krista Blue     Medication List    TAKE these medications        cyanocobalamin 500 MCG tablet  Take 500 mcg by mouth daily.     ferrous gluconate 324 MG tablet  Commonly known as:  FERGON     fexofenadine 180 MG tablet  Commonly known as:  ALLEGRA ALLERGY  Take 1 tablet (180 mg total) by mouth daily.     FLUoxetine 20 MG capsule  Commonly known as:  PROZAC  Take 20 mg by mouth daily.     lamoTRIgine 150 MG tablet  Commonly known as:  LAMICTAL  Take 1 tablet (150 mg total) by mouth 2 (two) times daily.     multivitamin with minerals tablet  Take 1 tablet by mouth daily.     traZODone 50 MG tablet  Commonly known as:  DESYREL  50 mg at bedtime as needed for sleep.         Discharge Condition: *Stable    Disposition: Final discharge disposition not confirmed   Consults:  None    Significant Diagnostic Studies:  Ct Head Wo Contrast  03/14/2015   CLINICAL DATA:  Status post fall after seizure. Neck pain. Concern for head injury. Initial encounter.  EXAM: CT HEAD WITHOUT CONTRAST  CT CERVICAL SPINE WITHOUT CONTRAST  TECHNIQUE: Multidetector CT imaging of the head and cervical spine was performed following the standard protocol without intravenous contrast. Multiplanar CT image reconstructions of the cervical spine were also generated.  COMPARISON:  CT of the head performed 07/30/2013, and MRI of the brain performed 01/13/2015   FINDINGS: CT HEAD FINDINGS  There is no evidence of acute infarction, mass lesion, or intra- or extra-axial hemorrhage on CT.  The posterior fossa, including the cerebellum, brainstem and fourth ventricle, is within normal limits. The third and lateral ventricles, and basal ganglia are unremarkable in appearance. The cerebral hemispheres are symmetric in appearance, with normal gray-white differentiation. No mass effect or midline shift is seen.  There is no evidence of fracture; visualized osseous structures are unremarkable in appearance. The orbits are within normal limits. The paranasal sinuses and mastoid air cells are well-aerated. No significant soft tissue abnormalities are seen.  CT CERVICAL SPINE FINDINGS  There is no evidence of fracture or subluxation. Vertebral bodies demonstrate normal height and alignment. Intervertebral disc spaces are preserved. Prevertebral soft tissues are within normal limits. The visualized neural foramina are grossly unremarkable.  The thyroid gland is unremarkable in appearance. The visualized lung apices are clear. No significant soft tissue abnormalities are seen.  IMPRESSION: 1. No evidence of traumatic intracranial injury or fracture. 2. No evidence of fracture or subluxation along the cervical spine.   Electronically Signed   By: Garald Balding M.D.   On: 03/14/2015 01:28   Ct Cervical Spine Wo Contrast  03/14/2015   CLINICAL DATA:  Status post fall after seizure. Neck pain. Concern for head injury. Initial encounter.  EXAM: CT HEAD WITHOUT CONTRAST  CT CERVICAL  SPINE WITHOUT CONTRAST  TECHNIQUE: Multidetector CT imaging of the head and cervical spine was performed following the standard protocol without intravenous contrast. Multiplanar CT image reconstructions of the cervical spine were also generated.  COMPARISON:  CT of the head performed 07/30/2013, and MRI of the brain performed 01/13/2015  FINDINGS: CT HEAD FINDINGS  There is no evidence of acute infarction, mass  lesion, or intra- or extra-axial hemorrhage on CT.  The posterior fossa, including the cerebellum, brainstem and fourth ventricle, is within normal limits. The third and lateral ventricles, and basal ganglia are unremarkable in appearance. The cerebral hemispheres are symmetric in appearance, with normal gray-white differentiation. No mass effect or midline shift is seen.  There is no evidence of fracture; visualized osseous structures are unremarkable in appearance. The orbits are within normal limits. The paranasal sinuses and mastoid air cells are well-aerated. No significant soft tissue abnormalities are seen.  CT CERVICAL SPINE FINDINGS  There is no evidence of fracture or subluxation. Vertebral bodies demonstrate normal height and alignment. Intervertebral disc spaces are preserved. Prevertebral soft tissues are within normal limits. The visualized neural foramina are grossly unremarkable.  The thyroid gland is unremarkable in appearance. The visualized lung apices are clear. No significant soft tissue abnormalities are seen.  IMPRESSION: 1. No evidence of traumatic intracranial injury or fracture. 2. No evidence of fracture or subluxation along the cervical spine.   Electronically Signed   By: Garald Balding M.D.   On: 03/14/2015 01:28        Filed Weights   03/14/15 1349  Weight: 83.8 kg (184 lb 11.9 oz)     Microbiology: No results found for this or any previous visit (from the past 240 hour(s)).     Blood Culture No results found for: SDES, SPECREQUEST, CULT, REPTSTATUS    Labs: Results for orders placed or performed during the hospital encounter of 03/13/15 (from the past 48 hour(s))  CK     Status: Abnormal   Collection Time: 03/15/15  5:35 AM  Result Value Ref Range   Total CK 30[address] (H) 38 - 234 U/L    Comment: RESULTS CONFIRMED BY MANUAL DILUTION  Basic metabolic panel     Status: Abnormal   Collection Time: 03/15/15  5:35 AM  Result Value Ref Range   Sodium 135 135 -  145 mmol/L   Potassium 3.5 3.5 - 5.1 mmol/L   Chloride 107 101 - 111 mmol/L   CO2 24 22 - 32 mmol/L   Glucose, Bld 94 65 - 99 mg/dL   BUN <5 (L) 6 - 20 mg/dL   Creatinine, Ser 0.65 0.44 - 1.00 mg/dL   Calcium 7.8 (L) 8.9 - 10.3 mg/dL   GFR calc non Af Amer >60 >60 mL/min   GFR calc Af Amer >60 >60 mL/min    Comment: (NOTE) The eGFR has been calculated using the CKD EPI equation. This calculation has not been validated in all clinical situations. eGFR's persistently <60 mL/min signify possible Chronic Kidney Disease.    Anion gap 4 (L) 5 - 15  CBC     Status: None   Collection Time: 03/15/15  5:35 AM  Result Value Ref Range   WBC 6.5 4.0 - 10.5 K/uL   RBC 4.27 3.87 - 5.11 MIL/uL   Hemoglobin 13.2 12.0 - 15.0 g/dL   HCT 40.4 36.0 - 46.0 %   MCV 94.6 78.0 - 100.0 fL   MCH 30.9 26.0 - 34.0 pg   MCHC 32.7 30.0 - 36.0  g/dL   RDW 13.1 11.5 - 15.5 %   Platelets 191 150 - 400 K/uL  CK     Status: Abnormal   Collection Time: 03/16/15  5:30 AM  Result Value Ref Range   Total CK [address]47 (H) 38 - 234 U/L    Comment: RESULTS CONFIRMED BY MANUAL DILUTION  Comprehensive metabolic panel     Status: Abnormal   Collection Time: 03/16/15  5:30 AM  Result Value Ref Range   Sodium 135 135 - 145 mmol/L   Potassium 4.0 3.5 - 5.1 mmol/L   Chloride 104 101 - 111 mmol/L   CO2 27 22 - 32 mmol/L   Glucose, Bld 98 65 - 99 mg/dL   BUN 6 6 - 20 mg/dL   Creatinine, Ser 0.70 0.44 - 1.00 mg/dL   Calcium 8.2 (L) 8.9 - 10.3 mg/dL   Total Protein 6.0 (L) 6.5 - 8.1 g/dL   Albumin 3.1 (L) 3.5 - 5.0 g/dL   AST 355 (H) 15 - 41 U/L    Comment: RESULTS CONFIRMED BY MANUAL DILUTION   ALT 95 (H) 14 - 54 U/L   Alkaline Phosphatase 45 38 - 126 U/L   Total Bilirubin 0.6 0.3 - 1.2 mg/dL   GFR calc non Af Amer >60 >60 mL/min   GFR calc Af Amer >60 >60 mL/min    Comment: (NOTE) The eGFR has been calculated using the CKD EPI equation. This calculation has not been validated in all clinical situations. eGFR's  persistently <60 mL/min signify possible Chronic Kidney Disease.    Anion gap 4 (L) 5 - 15     Lipid Panel  No results found for: CHOL, TRIG, HDL, CHOLHDL, VLDL, LDLCALC, LDLDIRECT   No results found for: HGBA1C   Lab Results  Component Value Date   CREATININE 0.70 03/16/2015     HPI :Carol Fowler is a 44 y.o. female with h/o seizures, and depression, presents to ED BY EMS, after an episode of seizures yesterday , unwitnessed , followed by a fall, after which she reports she couldn't walk and had to drag herself to the neighbor who called EMS and brought her to ED. Here she reports being sore all over and bruising of her legs, . She denies any other complaints. Neurology was consulted by EDP , who recommended psychiatry . Psychiatry cleared her and she was referred to medical service for admission for further eval. Her lab work revealed elevated wbc count, and elevated CK levels. Her UA is negative for infection. CT head and cervical spine did nto reveal any acute fracture or injury.  She was admitted for rhabdomyolysis.   HOSPITAL COURSE:   Seizures: She reports missing her dose for 2 consecutive days prior to admission, doesn't remember where her lamictal was at home.  She missed her appt with her neurologist on Friday.  Neurology recommends to continue with same dose of Lamictal 150 mg twice a day no changes are being made at this time, continue Seizure precautions.  Patient verbally and in writing advised not to drive, until cleared by neurology   [place]dom[place]: [place] [address], upon admission, now  20147 after aggressive IV fluids  Probably from being on the floor. Repeat CK in the outpatient setting   PT/OT evaluation, no PT follow-up.    Anxiety and depression: Appreciate psychiatry recommendations. Cleared by psychiatry   Leukocytosis: Afebrile, ua negative and no upper respiratory symptoms,.  Probably reactive.  Monitor.   Discharge Exam   Blood pressure  125/81,  pulse 79, temperature 98.4 F (36.9 C), temperature source Oral, resp. rate 20, height '5\' 3"'  (1.6 m), weight 83.8 kg (184 lb 11.9 oz), SpO2 100 %.  General: No acute respiratory distress Lungs: Clear to auscultation bilaterally without wheezes or crackles Cardiovascular: Regular rate and rhythm without murmur gallop or rub normal S1 and S2 Abdomen: Nontender, nondistended, soft, bowel sounds positive, no rebound, no ascites, no appreciable mass Extremities: No significant cyanosis, clubbing, or edema bilateral lower extremities       Discharge Instructions    Diet - low sodium heart healthy    Complete by:  As directed      Increase activity slowly    Complete by:  As directed            Follow-up Information    Follow up with Carol Rutter, MD. Schedule an appointment as soon as possible for a visit in 3 days.   Specialty:  Family Medicine      Follow up with Carol Pacas, MD. Schedule an appointment as soon as possible for a visit in 2 weeks.   Specialty:  Neurology   Contact information:   [place] [id number] [phone]       Signed: Reyne Dumas 03/16/2015, 1:18 PM        Time spent >45 mins

## 2015-03-16 NOTE — Telephone Encounter (Signed)
Pt had appt with [place] this am.   She is inpatient at [facility].

## 2015-03-16 NOTE — Evaluation (Signed)
Occupational Therapy Evaluation Patient Details Name: ANILA BOJARSKI MRN: [id number] DOB: 20-Sep-1970 Today's Date: 03/16/2015    History of Present Illness 44 y.o. female with h/o seizures and depression admitted after seizure with unwitnessed fall at home, with rhabdomyolysis.   Clinical Impression   Pt stood quickly from EOB so cautioned her to go slower with activity for safety. Pt stating bilateral LE/thigh pain at 6/10 with activity. Pt states her mother will be with her at d/c for awhile. Discussed not showering initially for safety to decrease fall risk and pt states she is fine to sponge bathe. Will follow to increase safety and independence with self care tasks for d/c home.    Follow Up Recommendations  No OT follow up;Supervision/Assistance - 24 hour    Equipment Recommendations  None recommended by OT    Recommendations for Other Services       Precautions / Restrictions Precautions Precautions: Fall Restrictions Weight Bearing Restrictions: No      Mobility Bed Mobility Overal bed mobility: Modified Independent                Transfers Overall transfer level: Needs assistance Equipment used: Rolling walker (2 wheeled) Transfers: Sit to/from Stand Sit to Stand: Min guard         General transfer comment: verbal cues for safety. pt tends to stand quickly. Cautioned her to go slower for safety.     Balance                                            ADL Overall ADL's : Needs assistance/impaired Eating/Feeding: Independent;Sitting   Grooming: Oral care;Min guard;Standing   Upper Body Bathing: Set up;Sitting   Lower Body Bathing: Min guard;Sit to/from stand   Upper Body Dressing : Set up;Sitting   Lower Body Dressing: Min guard;Sit to/from stand   Toilet Transfer: Min guard;Minimal assistance;Ambulation;Comfort height toilet;Grab bars   Toileting- Clothing Manipulation and Hygiene: Minimal assistance;Sit to/from stand         General ADL Comments: Min assist to manage gowns at commode to make sure they didnt get into commode-pt tending to sit quickly. Pt stood also quickly from EOB so cautioned pt to make sure she transitions to each position slowly and monitor dizziness and if LEs feel ok to continue standing. Pt reporting bilateral thighs are sore 6/10. Educated on 3in1 option but pt stating she has a vanity next to commodes at home and feels this will be adequate. Discussed not showering initially with her LE pain/soreness and would be better to sponge bathe initially.      Vision     Perception     Praxis      Pertinent Vitals/Pain Pain Assessment: 0-10 Pain Score: 6  Pain Location: bilateral thighs, back, L UE Pain Intervention(s): Monitored during session;Repositioned     Hand Dominance Left   Extremity/Trunk Assessment Upper Extremity Assessment Upper Extremity Assessment: RUE deficits/detail;LUE deficits/detail RUE Deficits / Details: grossly reaching approximately 120 degrees shoulder flexion, pt reports as sore. elbow to distal  grossly WFl. grip good.  LUE Deficits / Details: grossly reaching approximately 100 degrees shoulder flexion. Pt states L shoudler more sore than R. grip good. elbow distal WFL.           Communication Communication Communication: No difficulties   Cognition Arousal/Alertness: Awake/alert Behavior During Therapy: WFL for tasks assessed/performed Overall Cognitive Status:  Within Functional Limits for tasks assessed                     General Comments       Exercises       Shoulder Instructions      Home Living Family/patient expects to be discharged to:: Private residence Living Arrangements: Alone Available Help at Discharge: Family (states mother can be with her initially) Type of Home: Apartment Home Access: Stairs to enter Secretary/administrator of Steps: flight Entrance Stairs-Rails: Right Home Layout: One level     Bathroom  Shower/Tub: Producer, television/film/video: Standard     Home Equipment: None   Additional Comments: reports daughter going to school at [employer] this year      Prior Functioning/Environment Level of Independence: Independent             OT Diagnosis: Generalized weakness   OT Problem List: Decreased strength;Decreased knowledge of use of DME or AE   OT Treatment/Interventions: Self-care/ADL training;Patient/family education;Therapeutic activities;DME and/or AE instruction    OT Goals(Current goals can be found in the care plan section) Acute Rehab OT Goals Patient Stated Goal: home when ready OT Goal Formulation: With patient Time For Goal Achievement: 03/30/15 Potential to Achieve Goals: Good  OT Frequency: Min 2X/week   Barriers to D/C:            Co-evaluation              End of Session Equipment Utilized During Treatment: Gait belt;Rolling walker  Activity Tolerance: Patient tolerated treatment well Patient left: in bed;with call bell/phone within reach;with bed alarm set   Time: [id number] OT Time Calculation (min): 23 min Charges:  OT General Charges $OT Visit: 1 Procedure OT Evaluation $Initial OT Evaluation Tier I: 1 Procedure OT Treatments $Therapeutic Activity: 8-22 mins G-Codes:    Lennox Laity  [phone] 03/16/2015, 11:41 AM

## 2015-03-24 ENCOUNTER — Telehealth: Payer: Self-pay | Admitting: Neurology

## 2015-03-24 NOTE — Telephone Encounter (Signed)
Pt called and states that Saturday she did not take her seizure medication all day and had a seizure later that day. She states that she was with her mother and her mother called her sister for help.The sister called the police and had her picked up and sent to [facility] where she has been served with IVC papers. Patient does not understand why she was IVC'D. Sister has told pt that she was trying to hurt her mother during the seizure. Pt wants to have Dr. Terrace Arabia call and talk with medical personnel about her care and medication. Pt states that they are not giving her the medication she needs. Pt is currently at [facility] [phone]. Pt may receive calls.

## 2015-03-24 NOTE — Telephone Encounter (Signed)
Returned patient's call to [facility].  They would not provide me with any information concerning the patient because I did not have her patient code.  They could not even confirmed her being there.  With the sensitive nature of the report below, the only option is to wait for the patient to contact us again.

## 2015-04-01 ENCOUNTER — Ambulatory Visit: Payer: Federal, State, Local not specified - PPO | Admitting: Neurology

## 2015-04-21 ENCOUNTER — Ambulatory Visit (INDEPENDENT_AMBULATORY_CARE_PROVIDER_SITE_OTHER): Payer: Federal, State, Local not specified - PPO | Admitting: Neurology

## 2015-04-21 ENCOUNTER — Encounter: Payer: Self-pay | Admitting: Neurology

## 2015-04-21 VITALS — BP 127/83 | HR 93 | Ht 63.0 in | Wt 187.0 lb

## 2015-04-21 DIAGNOSIS — F05 Delirium due to known physiological condition: Secondary | ICD-10-CM | POA: Diagnosis not present
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
## 2015-04-29 ENCOUNTER — Telehealth: Payer: Self-pay | Admitting: Neurology

## 2015-04-29 NOTE — Telephone Encounter (Signed)
Called patient left her a message asking her to call me back [facility] .Telephone [phone] fax [phone].

## 2015-05-03 ENCOUNTER — Telehealth: Payer: Self-pay | Admitting: Neurology

## 2015-05-03 DIAGNOSIS — Z0271 Encounter for disability determination: Secondary | ICD-10-CM

## 2015-05-03 NOTE — Telephone Encounter (Signed)
Pt called sts she needs letter for [employer] for disability retirement that states dx, medication and treatment plan. It must be mailed to [phone] [facility] [place] [phone] attn Human Resources. Pt states need letter by October 30,2016. Please call and let pt know when form is mailed at [phone]

## 2015-05-04 NOTE — Telephone Encounter (Signed)
Patient aware that the letter is being mail - it will be the original letter with her signature.  It was placed in the mail today, to the address requested below.

## 2015-05-04 NOTE — Telephone Encounter (Addendum)
Spoke to patient - she does not need a letter for disability - she needs a signed physician's statement that includes the following (for her HR department): Diagnosis, treatment plan and longevity of condition.  Letter is ready.  Carol FeinsteinYijun Yan, M.D. Ph.D.  [facility] [address] [place], [place] [address] Phone: [phone] Fax:      [phone]

## 2015-05-12 NOTE — Telephone Encounter (Signed)
Spoke to patient - aware that letter was mailed to her human resources department on 05/04/15 - verified it was sent to the correct address.  She is going to call and check with her HR dept.

## 2015-05-12 NOTE — Telephone Encounter (Signed)
Patient called back to check status of letter, I did advise that letter was placed in the mail 05/04/15 to the address requested. Patient has more questions regarding what information was in the letter, if it was signed by the Doctor and other questions. Patient requests a call back from the nurse. Please call [phone].

## 2015-06-17 ENCOUNTER — Telehealth: Payer: Self-pay | Admitting: Neurology

## 2015-06-17 NOTE — Telephone Encounter (Signed)
Patient called to advise [facility]'t [facility] without a telephone call from Dr. Terrace ArabiaYan telling them what she wants, they did receive records, patient states the year is about to expire and she is getting ready to change insurance.

## 2015-06-17 NOTE — Telephone Encounter (Signed)
[facility] [phone], fax [phone]) on 04/29/15.  Patient has not scheduled.  I called to let [facility] know that the test still needs to be completed and re-faxed the orders, along with the patient's contact information.  I also provided the patient with her direct number.  Patient will call her back for scheduling.

## 2015-06-30 DIAGNOSIS — Z0289 Encounter for other administrative examinations: Secondary | ICD-10-CM

## 2015-08-25 ENCOUNTER — Ambulatory Visit: Payer: Federal, State, Local not specified - PPO | Admitting: Neurology

## 2015-11-24 DIAGNOSIS — Z0289 Encounter for other administrative examinations: Secondary | ICD-10-CM

## 2016-08-21 IMAGING — CR DG LUMBAR SPINE COMPLETE 4+V
5 series · 5 of 5 positions shown · non-contrast
Comparison: None.

CLINICAL DATA: Low back pain for from recent fall 3 days ago,
initial encounter

EXAM:
LUMBAR SPINE - COMPLETE 4+ VIEW

[AP]
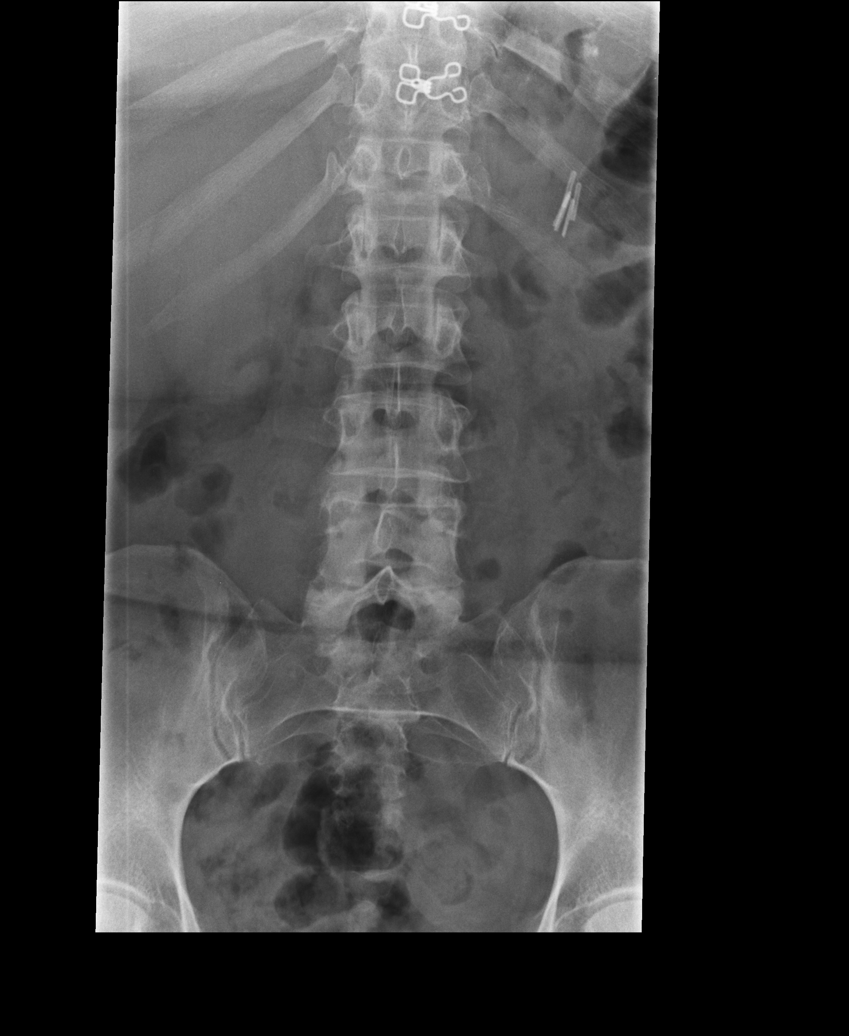

[rpo]
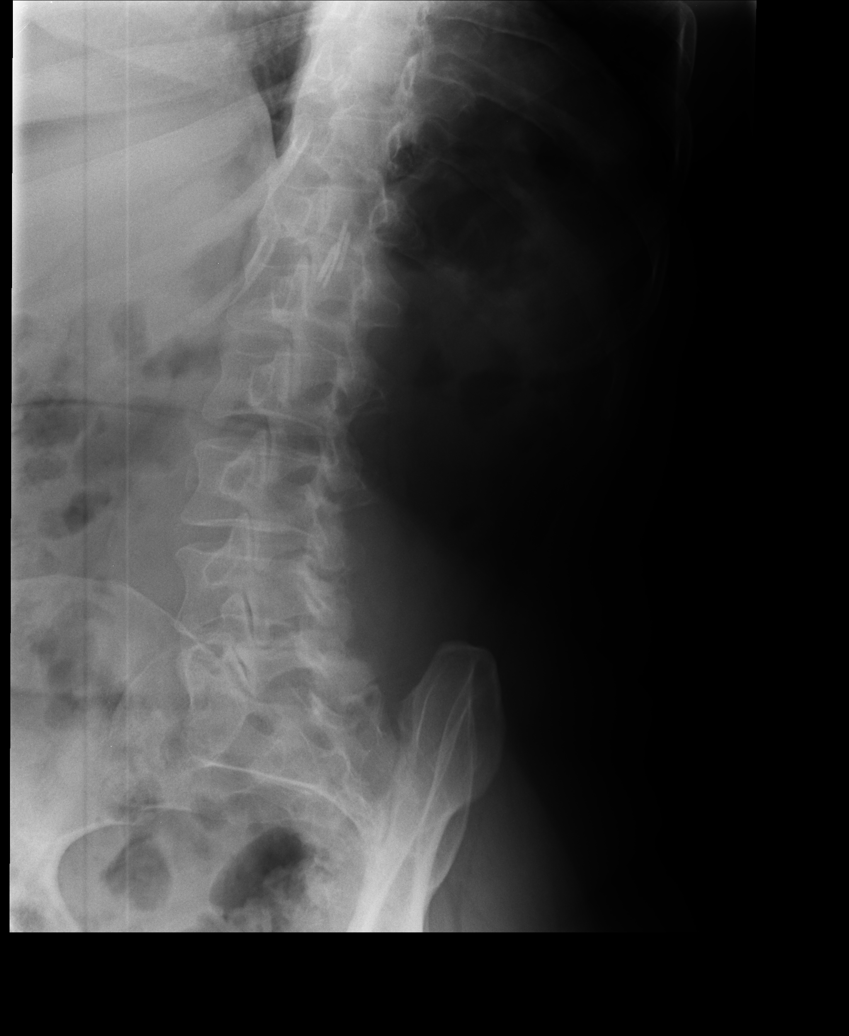

[lpo]
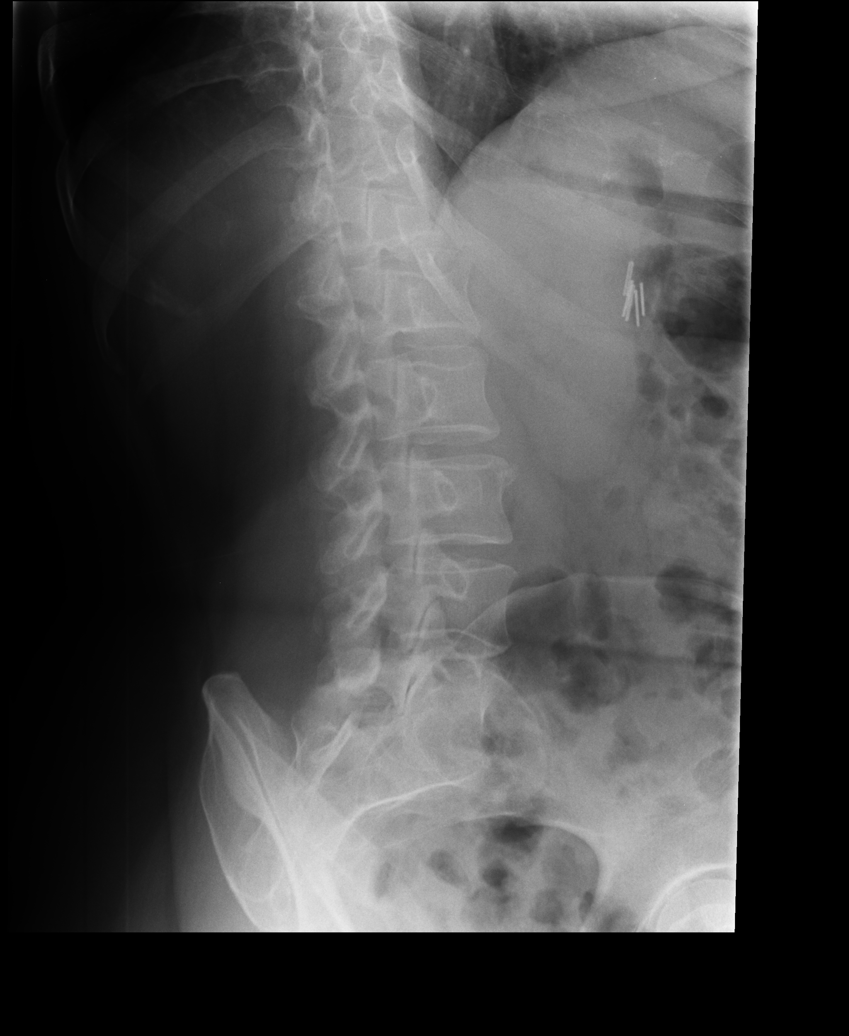

[lateral]
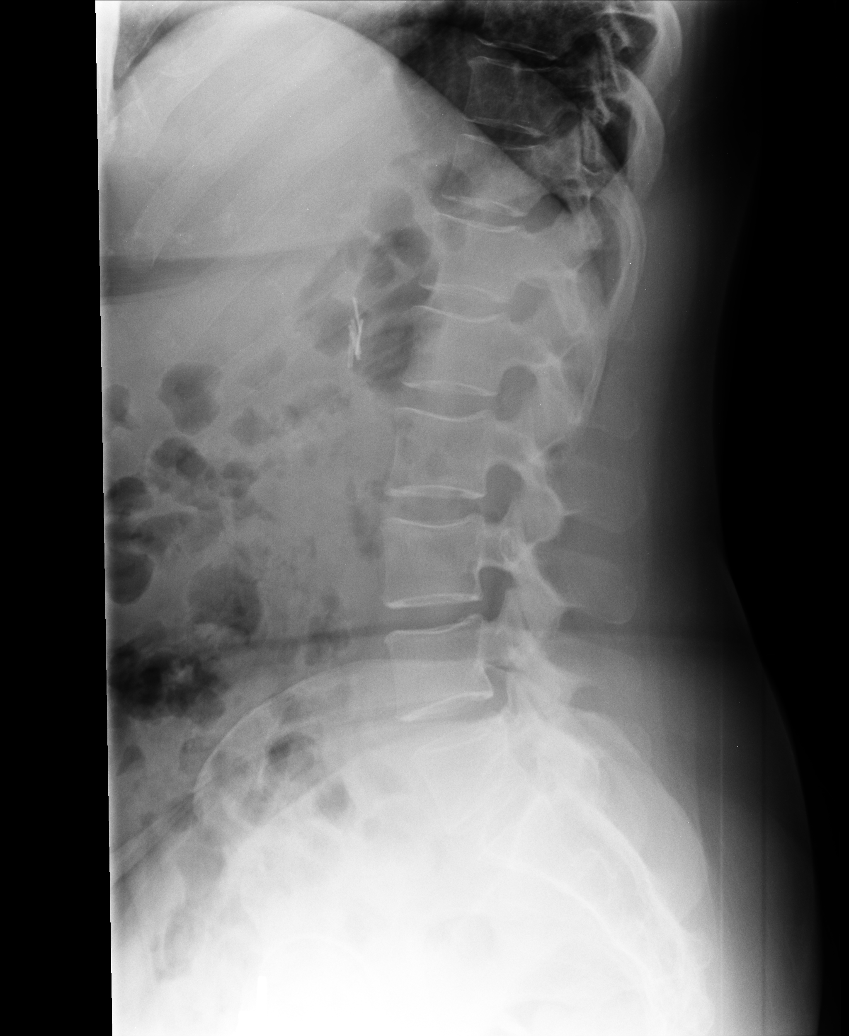

[l5 s1]
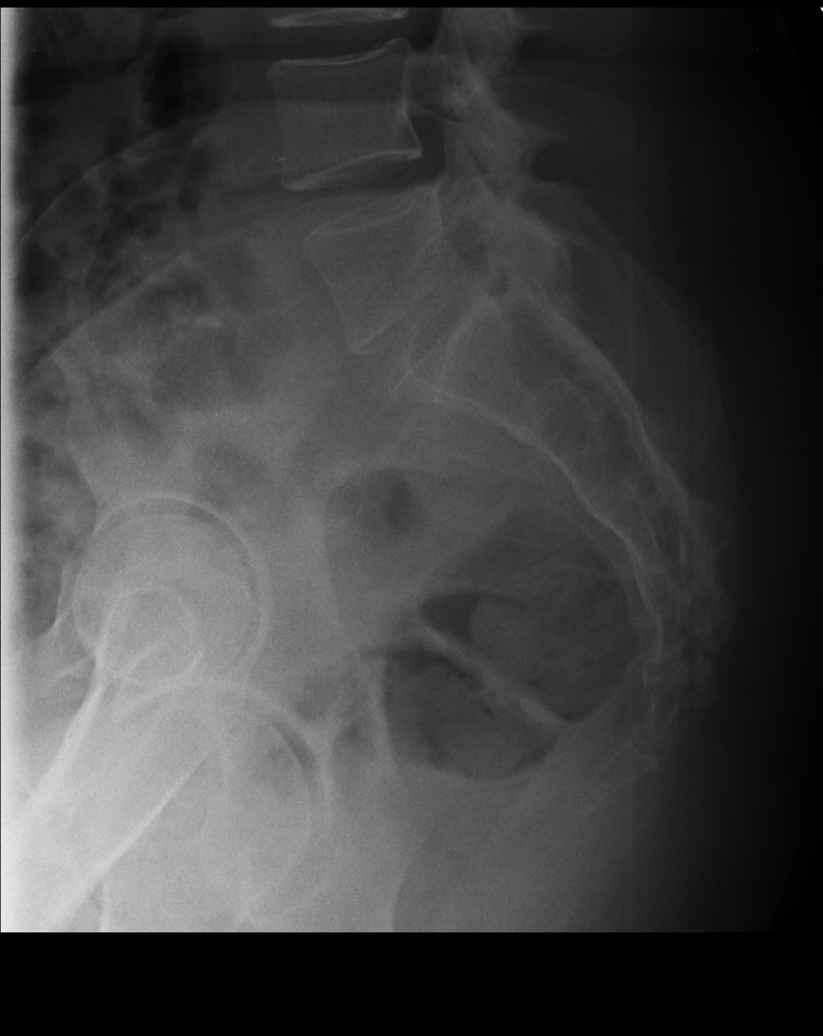

[5 of 5 positions shown; findings below may reference images not displayed]

FINDINGS: There is no evidence of lumbar spine fracture. Alignment is normal.
Intervertebral disc spaces are maintained. Mild facet hypertrophic
changes are noted.
IMPRESSION: Mild degenerative change without acute abnormality.
